# Patient Record
Sex: Male | Born: 1955 | Race: White | Hispanic: No | Marital: Married | Smoking: Former smoker
Health system: Southern US, Community
[De-identification: ages and names within clinical notes are randomized; demographics above are authoritative.]

## PROBLEM LIST (undated history)

## (undated) DIAGNOSIS — E785 Hyperlipidemia, unspecified: Secondary | ICD-10-CM

## (undated) DIAGNOSIS — F32A Depression, unspecified: Secondary | ICD-10-CM

## (undated) DIAGNOSIS — K219 Gastro-esophageal reflux disease without esophagitis: Secondary | ICD-10-CM

## (undated) DIAGNOSIS — R7302 Impaired glucose tolerance (oral): Secondary | ICD-10-CM

## (undated) DIAGNOSIS — F419 Anxiety disorder, unspecified: Secondary | ICD-10-CM

## (undated) DIAGNOSIS — T7840XA Allergy, unspecified, initial encounter: Secondary | ICD-10-CM

## (undated) DIAGNOSIS — F329 Major depressive disorder, single episode, unspecified: Secondary | ICD-10-CM

## (undated) HISTORY — DX: Major depressive disorder, single episode, unspecified: F32.9

## (undated) HISTORY — DX: Depression, unspecified: F32.A

## (undated) HISTORY — DX: Allergy, unspecified, initial encounter: T78.40XA

## (undated) HISTORY — DX: Impaired glucose tolerance (oral): R73.02

## (undated) HISTORY — DX: Anxiety disorder, unspecified: F41.9

## (undated) HISTORY — PX: APPENDECTOMY: SHX54

## (undated) HISTORY — PX: CHOLECYSTECTOMY: SHX55

## (undated) HISTORY — DX: Gastro-esophageal reflux disease without esophagitis: K21.9

## (undated) HISTORY — DX: Hyperlipidemia, unspecified: E78.5

---

## 1998-08-02 HISTORY — PX: POLYSOMNOGRAPHY: SHX453

## 2001-05-24 ENCOUNTER — Encounter: Admission: RE | Admit: 2001-05-24 | Discharge: 2001-05-24 | Payer: Self-pay | Admitting: Family Medicine

## 2001-05-30 ENCOUNTER — Encounter: Admission: RE | Admit: 2001-05-30 | Discharge: 2001-05-30 | Payer: Self-pay | Admitting: Family Medicine

## 2001-06-21 ENCOUNTER — Encounter: Admission: RE | Admit: 2001-06-21 | Discharge: 2001-06-21 | Payer: Self-pay | Admitting: Family Medicine

## 2002-07-04 ENCOUNTER — Encounter: Admission: RE | Admit: 2002-07-04 | Discharge: 2002-07-04 | Payer: Self-pay | Admitting: Family Medicine

## 2002-08-08 ENCOUNTER — Encounter: Admission: RE | Admit: 2002-08-08 | Discharge: 2002-08-08 | Payer: Self-pay | Admitting: Family Medicine

## 2002-09-20 ENCOUNTER — Encounter: Admission: RE | Admit: 2002-09-20 | Discharge: 2002-09-20 | Payer: Self-pay | Admitting: Family Medicine

## 2002-09-25 ENCOUNTER — Encounter: Admission: RE | Admit: 2002-09-25 | Discharge: 2002-09-25 | Payer: Self-pay | Admitting: Family Medicine

## 2002-10-02 ENCOUNTER — Encounter: Payer: Self-pay | Admitting: Sports Medicine

## 2002-10-02 ENCOUNTER — Encounter: Admission: RE | Admit: 2002-10-02 | Discharge: 2002-10-02 | Payer: Self-pay | Admitting: Sports Medicine

## 2002-10-22 ENCOUNTER — Ambulatory Visit (HOSPITAL_COMMUNITY): Admission: RE | Admit: 2002-10-22 | Discharge: 2002-10-22 | Payer: Self-pay | Admitting: Family Medicine

## 2002-10-22 ENCOUNTER — Encounter: Payer: Self-pay | Admitting: Family Medicine

## 2002-10-31 ENCOUNTER — Encounter: Admission: RE | Admit: 2002-10-31 | Discharge: 2002-10-31 | Payer: Self-pay | Admitting: Family Medicine

## 2003-04-17 ENCOUNTER — Encounter: Admission: RE | Admit: 2003-04-17 | Discharge: 2003-04-17 | Payer: Self-pay | Admitting: Family Medicine

## 2003-05-01 ENCOUNTER — Encounter: Admission: RE | Admit: 2003-05-01 | Discharge: 2003-05-01 | Payer: Self-pay | Admitting: Family Medicine

## 2003-05-07 ENCOUNTER — Encounter: Admission: RE | Admit: 2003-05-07 | Discharge: 2003-05-07 | Payer: Self-pay | Admitting: Family Medicine

## 2003-05-09 ENCOUNTER — Encounter: Admission: RE | Admit: 2003-05-09 | Discharge: 2003-05-09 | Payer: Self-pay | Admitting: Family Medicine

## 2003-05-10 ENCOUNTER — Encounter: Admission: RE | Admit: 2003-05-10 | Discharge: 2003-05-10 | Payer: Self-pay | Admitting: Sports Medicine

## 2003-05-10 ENCOUNTER — Encounter: Payer: Self-pay | Admitting: Sports Medicine

## 2003-05-16 ENCOUNTER — Encounter: Admission: RE | Admit: 2003-05-16 | Discharge: 2003-05-16 | Payer: Self-pay | Admitting: Sports Medicine

## 2003-05-16 ENCOUNTER — Encounter: Admission: RE | Admit: 2003-05-16 | Discharge: 2003-05-16 | Payer: Self-pay | Admitting: Family Medicine

## 2003-05-30 ENCOUNTER — Encounter: Admission: RE | Admit: 2003-05-30 | Discharge: 2003-05-30 | Payer: Self-pay | Admitting: Family Medicine

## 2003-06-06 ENCOUNTER — Encounter: Admission: RE | Admit: 2003-06-06 | Discharge: 2003-06-06 | Payer: Self-pay | Admitting: Sports Medicine

## 2003-06-07 ENCOUNTER — Encounter: Admission: RE | Admit: 2003-06-07 | Discharge: 2003-06-07 | Payer: Self-pay | Admitting: Sports Medicine

## 2003-06-20 ENCOUNTER — Encounter: Admission: RE | Admit: 2003-06-20 | Discharge: 2003-06-20 | Payer: Self-pay | Admitting: Family Medicine

## 2003-07-04 ENCOUNTER — Encounter: Admission: RE | Admit: 2003-07-04 | Discharge: 2003-07-04 | Payer: Self-pay | Admitting: Family Medicine

## 2003-07-11 ENCOUNTER — Encounter: Admission: RE | Admit: 2003-07-11 | Discharge: 2003-07-11 | Payer: Self-pay | Admitting: Family Medicine

## 2003-07-18 ENCOUNTER — Encounter: Admission: RE | Admit: 2003-07-18 | Discharge: 2003-07-18 | Payer: Self-pay | Admitting: Family Medicine

## 2003-08-01 ENCOUNTER — Encounter: Admission: RE | Admit: 2003-08-01 | Discharge: 2003-08-01 | Payer: Self-pay | Admitting: Family Medicine

## 2003-08-08 ENCOUNTER — Encounter: Admission: RE | Admit: 2003-08-08 | Discharge: 2003-08-08 | Payer: Self-pay | Admitting: Family Medicine

## 2003-08-15 ENCOUNTER — Encounter: Admission: RE | Admit: 2003-08-15 | Discharge: 2003-08-15 | Payer: Self-pay | Admitting: Sports Medicine

## 2003-08-29 ENCOUNTER — Encounter: Admission: RE | Admit: 2003-08-29 | Discharge: 2003-08-29 | Payer: Self-pay | Admitting: Family Medicine

## 2003-12-19 ENCOUNTER — Encounter: Admission: RE | Admit: 2003-12-19 | Discharge: 2003-12-19 | Payer: Self-pay | Admitting: Sports Medicine

## 2003-12-20 ENCOUNTER — Encounter: Admission: RE | Admit: 2003-12-20 | Discharge: 2003-12-20 | Payer: Self-pay | Admitting: Sports Medicine

## 2006-07-08 ENCOUNTER — Observation Stay (HOSPITAL_COMMUNITY): Admission: EM | Admit: 2006-07-08 | Discharge: 2006-07-09 | Payer: Self-pay | Admitting: Emergency Medicine

## 2007-07-11 LAB — HM COLONOSCOPY: HM COLON: NORMAL

## 2010-03-06 ENCOUNTER — Encounter: Admission: RE | Admit: 2010-03-06 | Discharge: 2010-03-06 | Payer: Self-pay | Admitting: Internal Medicine

## 2010-03-13 ENCOUNTER — Inpatient Hospital Stay (HOSPITAL_COMMUNITY): Admission: AD | Admit: 2010-03-13 | Discharge: 2010-03-14 | Payer: Self-pay | Admitting: Family Medicine

## 2010-03-13 ENCOUNTER — Ambulatory Visit: Payer: Self-pay | Admitting: Family Medicine

## 2010-03-16 ENCOUNTER — Emergency Department (HOSPITAL_COMMUNITY): Admission: EM | Admit: 2010-03-16 | Discharge: 2010-03-16 | Payer: Self-pay | Admitting: Emergency Medicine

## 2010-03-20 ENCOUNTER — Encounter: Payer: Self-pay | Admitting: Family Medicine

## 2010-03-20 ENCOUNTER — Inpatient Hospital Stay (HOSPITAL_COMMUNITY): Admission: EM | Admit: 2010-03-20 | Discharge: 2010-03-21 | Payer: Self-pay | Admitting: Emergency Medicine

## 2010-06-09 ENCOUNTER — Encounter (INDEPENDENT_AMBULATORY_CARE_PROVIDER_SITE_OTHER): Payer: Self-pay | Admitting: *Deleted

## 2010-09-01 NOTE — Letter (Signed)
Summary: LEC Referral (unable to schedule) Notification  [facility]  [address] [place], [place] [address]   Phone: [phone]  Fax: [phone]      June 09, 2010 Brian Houston 05/09/1956 MRN: [id number]   [facility] [address] [place], [place]  [address]   Dear Dr. Merla Riches:   Thank you for your kind referral of the above patient. We have attempted to schedule the recommended COLONOSCOPY but have been unable to schedule because:  _X_ The patient was not available by phone and/or has not returned our calls.  __ The patient declined to schedule the procedure at this time.  We appreciate the referral and hope that we will have the opportunity to treat this patient in the future.    Sincerely,   [facility]  Vania Rea. Jarold Motto M.D. Hedwig Morton. Juanda Chance M.D. Venita Lick. Russella Dar M.D. Wilhemina Bonito. Marina Goodell M.D. Barbette Hair. Arlyce Dice M.D. Iva Boop M.D. Cheron Every.D.

## 2010-10-08 ENCOUNTER — Other Ambulatory Visit: Payer: Self-pay | Admitting: Family Medicine

## 2010-10-16 LAB — CBC
HCT: 43 % (ref 39.0–52.0)
Hemoglobin: 14.6 g/dL (ref 13.0–17.0)
Hemoglobin: 15.1 g/dL (ref 13.0–17.0)
MCH: 31.3 pg (ref 26.0–34.0)
MCH: 31.4 pg (ref 26.0–34.0)
MCHC: 33.2 g/dL (ref 30.0–36.0)
MCHC: 33.5 g/dL (ref 30.0–36.0)
MCHC: 34 g/dL (ref 30.0–36.0)
MCV: 92.5 fL (ref 78.0–100.0)
MCV: 93.7 fL (ref 78.0–100.0)
Platelets: 364 10*3/uL (ref 150–400)
Platelets: 393 10*3/uL (ref 150–400)
Platelets: 436 10*3/uL — ABNORMAL HIGH (ref 150–400)
Platelets: 500 10*3/uL — ABNORMAL HIGH (ref 150–400)
RBC: 4.11 MIL/uL — ABNORMAL LOW (ref 4.22–5.81)
RBC: 4.27 MIL/uL (ref 4.22–5.81)
RBC: 4.64 MIL/uL (ref 4.22–5.81)
RBC: 4.83 MIL/uL (ref 4.22–5.81)
RDW: 12.8 % (ref 11.5–15.5)
RDW: 12.9 % (ref 11.5–15.5)
WBC: 25.7 10*3/uL — ABNORMAL HIGH (ref 4.0–10.5)

## 2010-10-16 LAB — COMPREHENSIVE METABOLIC PANEL
ALT: 25 U/L (ref 0–53)
ALT: 76 U/L — ABNORMAL HIGH (ref 0–53)
AST: 14 U/L (ref 0–37)
AST: 20 U/L (ref 0–37)
Albumin: 2.6 g/dL — ABNORMAL LOW (ref 3.5–5.2)
Albumin: 3.1 g/dL — ABNORMAL LOW (ref 3.5–5.2)
Albumin: 3.3 g/dL — ABNORMAL LOW (ref 3.5–5.2)
Alkaline Phosphatase: 102 U/L (ref 39–117)
Alkaline Phosphatase: 109 U/L (ref 39–117)
BUN: 14 mg/dL (ref 6–23)
BUN: 15 mg/dL (ref 6–23)
BUN: 16 mg/dL (ref 6–23)
BUN: 19 mg/dL (ref 6–23)
BUN: 20 mg/dL (ref 6–23)
CO2: 27 mEq/L (ref 19–32)
CO2: 27 mEq/L (ref 19–32)
CO2: 28 mEq/L (ref 19–32)
Calcium: 8.9 mg/dL (ref 8.4–10.5)
Calcium: 9.2 mg/dL (ref 8.4–10.5)
Chloride: 101 mEq/L (ref 96–112)
Chloride: 102 mEq/L (ref 96–112)
Chloride: 102 mEq/L (ref 96–112)
Chloride: 105 mEq/L (ref 96–112)
Creatinine, Ser: 0.98 mg/dL (ref 0.4–1.5)
Creatinine, Ser: 1.02 mg/dL (ref 0.4–1.5)
Creatinine, Ser: 1.11 mg/dL (ref 0.4–1.5)
GFR calc Af Amer: >60 mL/min (ref >60)
GFR calc Af Amer: >60 mL/min (ref >60)
GFR calc Af Amer: >60 mL/min (ref >60)
GFR calc non Af Amer: >60 mL/min (ref >60)
GFR calc non Af Amer: >60 mL/min (ref >60)
GFR calc non Af Amer: >60 mL/min (ref >60)
GFR calc non Af Amer: >60 mL/min (ref >60)
Glucose, Bld: 130 mg/dL — ABNORMAL HIGH (ref 70–99)
Glucose, Bld: 132 mg/dL — ABNORMAL HIGH (ref 70–99)
Glucose, Bld: 142 mg/dL — ABNORMAL HIGH (ref 70–99)
Potassium: 3.6 mEq/L (ref 3.5–5.1)
Potassium: 3.6 mEq/L (ref 3.5–5.1)
Potassium: 3.7 mEq/L (ref 3.5–5.1)
Potassium: 4.7 mEq/L (ref 3.5–5.1)
Sodium: 139 mEq/L (ref 135–145)
Sodium: 140 mEq/L (ref 135–145)
Total Bilirubin: 0.6 mg/dL (ref 0.3–1.2)
Total Bilirubin: 0.9 mg/dL (ref 0.3–1.2)
Total Protein: 5.1 g/dL — ABNORMAL LOW (ref 6.0–8.3)

## 2010-10-16 LAB — URINALYSIS, ROUTINE W REFLEX MICROSCOPIC
Bilirubin Urine: NEGATIVE
Glucose, UA: NEGATIVE mg/dL
Glucose, UA: NEGATIVE mg/dL
Glucose, UA: NEGATIVE mg/dL
Hgb urine dipstick: NEGATIVE
Hgb urine dipstick: NEGATIVE
Ketones, ur: 15 mg/dL — AB
Ketones, ur: NEGATIVE mg/dL
Protein, ur: NEGATIVE mg/dL
Protein, ur: NEGATIVE mg/dL
Protein, ur: NEGATIVE mg/dL
Specific Gravity, Urine: 1.02 (ref 1.005–1.030)
Specific Gravity, Urine: 1.02 (ref 1.005–1.030)
pH: 6.5 (ref 5.0–8.0)
pH: 7 (ref 5.0–8.0)

## 2010-10-16 LAB — DIFFERENTIAL
Basophils Absolute: 0 10*3/uL (ref 0.0–0.1)
Eosinophils Absolute: 0 10*3/uL (ref 0.0–0.7)
Eosinophils Absolute: 0 10*3/uL (ref 0.0–0.7)
Eosinophils Relative: 0 % (ref 0–5)
Lymphocytes Relative: 13 % (ref 12–46)
Lymphocytes Relative: 9 % — ABNORMAL LOW (ref 12–46)
Lymphs Abs: 2.3 10*3/uL (ref 0.7–4.0)
Lymphs Abs: 3.4 10*3/uL (ref 0.7–4.0)
Monocytes Absolute: 1 10*3/uL (ref 0.1–1.0)
Monocytes Relative: 4 % (ref 3–12)

## 2010-10-16 LAB — LACTIC ACID, PLASMA: Lactic Acid, Venous: 1.5 mmol/L (ref 0.5–2.2)

## 2010-10-16 LAB — CULTURE, BLOOD (ROUTINE X 2)
Culture: NO GROWTH
Culture: NO GROWTH

## 2010-10-16 LAB — HEPATITIS PANEL, ACUTE
Hep A IgM: NEGATIVE
Hep B C IgM: NEGATIVE
Hepatitis B Surface Ag: NEGATIVE

## 2010-10-16 LAB — CLOSTRIDIUM DIFFICILE EIA

## 2010-10-16 LAB — URINE CULTURE
Colony Count: NO GROWTH
Culture  Setup Time: 201108130312
Special Requests: NEGATIVE

## 2010-10-16 LAB — GIARDIA/CRYPTOSPORIDIUM SCREEN(EIA): Giardia Screen - EIA: NEGATIVE

## 2010-10-16 LAB — POCT CARDIAC MARKERS
CKMB, poc: <1 ng/mL — ABNORMAL LOW (ref 1.0–8.0)
CKMB, poc: <1 ng/mL — ABNORMAL LOW (ref 1.0–8.0)
Troponin i, poc: <0.05 ng/mL (ref 0.00–0.09)
Troponin i, poc: <0.05 ng/mL (ref 0.00–0.09)

## 2010-10-16 LAB — RPR: RPR Ser Ql: NONREACTIVE

## 2010-10-16 LAB — STOOL CULTURE

## 2010-12-18 NOTE — H&P (Signed)
NAMEBRISCOE, DANIELLO                ACCOUNT NO.:  [id number]   MEDICAL RECORD NO.:  [id number]          PATIENT TYPE:  EMS   LOCATION:  MAJO                         FACILITY:  [facility]   PHYSICIAN:  Corinna L. Lendell Caprice, MDDATE OF BIRTH:  [birth year]   DATE OF ADMISSION:  07/08/2006  DATE OF DISCHARGE:                              HISTORY & PHYSICAL   CHIEF COMPLAINT:  Chest pain and left arm weakness.   HISTORY OF PRESENT ILLNESS:  Mr. Biegel is a 55 year old white male with  a history of anxiety who presents to the emergency room with left-sided  chest pain, left arm pain and weakness.  He also had a cramp between his  shoulder blades.  He felt extremely weak today.  He has had weakness and  dyspnea on exertion for the past several weeks.  He had aspirin and  nitroglycerin en route without much change in his symptoms.  He felt  that his arm was weak and he could barely hold up the gun when he was  deer hunting today.  He had a panic attack 30 years ago and had  shortness of breath with that.  He had been on Paxil, but stopped it two  weeks ago abruptly.  His arm pain and weakness is better now.  He has no  shortness of breath currently.   PAST MEDICAL HISTORY:  Anxiety disorder.   MEDICATIONS:  None.   ALLERGIES:  PENICILLIN.   PAST SURGICAL HISTORY:  Appendectomy.   SOCIAL HISTORY:  The patient is a Education officer, environmental of a church.  He does not  drink, smoke or use drugs.   FAMILY HISTORY:  Significant for diabetes.  No heart disease.   REVIEW OF SYSTEMS:  As above, otherwise negative.   PHYSICAL EXAMINATION:  VITAL SIGNS:  His temperature is 97.3, blood  pressure initially was 85/50, currently 109/76.  Heart rate 82,  respiratory rate 20, oxygen saturation 97% on room air.  GENERAL:  The patient is well-nourished, well-developed and in no acute  distress.  HEENT:  Normocephalic, atraumatic.  Pupils equal, round, and reactive to  light.  Sclerae nonicteric.  Moist mucous membranes.  NECK:  Supple.  No carotid bruits.  LUNGS:  Clear to auscultation bilaterally without wheezes, rales, or  rhonchi.  CARDIOVASCULAR:  Regular rate and rhythm without murmurs, rubs, or  gallops. No chest wall tenderness.  ABDOMEN:  Soft, nontender, nondistended.  GU/RECTAL:  Deferred.  EXTREMITIES:  No clubbing, cyanosis, or edema.  He has no tenderness  about his left arm and full range of motion in all joints of the left  arm.  NEUROLOGIC:  Alert and oriented.  Cranial nerves intact.  Motor strength  5/5.  Sensation intact.  Deep tendon reflexes equal.  Skin no rash.  PSYCHIATRIC:  Normal affect.   LABORATORY:  Creatinine 1.1, one set of point of care enzymes and one  set of cardiac enzymes are negative.  Chest x-ray one view shows  questionable 4 mm diameter nodular density left base.  EKG shows normal  sinus rhythm.   ASSESSMENT/PLAN:  1. Atypical chest pain.  The patient will be placed on observation.  I      will rule out myocardial infarction.  I will also get a D-dimer.      Since he has had dyspnea on exertion, I will also check a b-type      natruretic peptide.  Consider an echocardiogram at some point.      With it being the weekend, I do not think he needs a STAT      echocardiogram, but maybe as an outpatient if he rules out for      myocardial infarction.  I will continue aspirin.  2. Reported left arm weakness, resolved currently.  The patient has a      nonfocal exam.  I will get a CT brain rule out stroke.  3. History of anxiety disorder.  This may all be anxiety related.  4. Dyspnea on exertion.  See above.  I will also check a TSH.  5. Indeterminate left lung nodule on portable chest x-ray.  I will get      a PA and lateral chest x-ray to further evaluation.      Corinna L. Lendell Caprice, MD  Electronically Signed     CLS/MEDQ  D:  07/08/2006  T:  07/09/2006  Job:  [id number]

## 2011-07-17 ENCOUNTER — Ambulatory Visit (INDEPENDENT_AMBULATORY_CARE_PROVIDER_SITE_OTHER): Payer: PRIVATE HEALTH INSURANCE

## 2011-07-17 DIAGNOSIS — E789 Disorder of lipoprotein metabolism, unspecified: Secondary | ICD-10-CM

## 2011-07-17 DIAGNOSIS — F411 Generalized anxiety disorder: Secondary | ICD-10-CM

## 2011-07-17 DIAGNOSIS — Z23 Encounter for immunization: Secondary | ICD-10-CM

## 2011-10-18 ENCOUNTER — Ambulatory Visit (INDEPENDENT_AMBULATORY_CARE_PROVIDER_SITE_OTHER): Payer: PRIVATE HEALTH INSURANCE | Admitting: Family Medicine

## 2011-10-18 VITALS — BP 108/73 | HR 65 | Temp 98.0°F | Resp 16 | Ht 71.0 in | Wt 190.2 lb

## 2011-10-18 DIAGNOSIS — G4700 Insomnia, unspecified: Secondary | ICD-10-CM

## 2011-10-18 DIAGNOSIS — F411 Generalized anxiety disorder: Secondary | ICD-10-CM

## 2011-10-18 DIAGNOSIS — R079 Chest pain, unspecified: Secondary | ICD-10-CM

## 2011-10-18 MED ORDER — TOPIRAMATE 50 MG PO TABS: 50.0000 mg | ORAL_TABLET | Freq: Two times a day (BID) | ORAL | Status: DC

## 2011-10-18 NOTE — Progress Notes (Signed)
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   Today on the way from the lawyer he had dizziness, mild nausea, pain between shoulder blades, slight shortness of breath, and mild chest tightness that started about 10:30 and worsened until 11:15 and has largely subsided.  Still a bit lightheaded and has a bit of pain between shoulder blades.  Walking in to the office aggravated the pain.   He had similar symptoms two weeks ago when working out in the field.  Cholesterol total 226 07/17/2011 Family history had aortic valve replacement, mother had wpw  O:  NAD Chest:  Clear Heart:  Reg, no murmur or gallop Abdomen:  Soft, no HSM, nontender EKG: NSR As I talked to him, he began to reflect on him at stress he's been under. He's thought about quitting his job thoughts of suicide. He says that because he has his children and his wife and grandchildren he would never carry out a neck such as this nor has he made plans of any type. He's thinking about taking twice a motorcycle trip this summer. 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 He said 7 suicides in the last year and many more funerals to go to.  Assessment: Patient is under treatment stress, do not feel that he has any risk factors or signs of heart attack. Going to try a different medicine to help him get some rest and he's going to try to get out of town at least one day week to clear his head.  : Plan: Topamax. I've asked patient: Back in 2-3 days to limit however the sleeping hours healing.

## 2012-01-21 ENCOUNTER — Other Ambulatory Visit: Payer: Self-pay | Admitting: Emergency Medicine

## 2012-02-18 ENCOUNTER — Ambulatory Visit (INDEPENDENT_AMBULATORY_CARE_PROVIDER_SITE_OTHER): Payer: PRIVATE HEALTH INSURANCE | Admitting: Family Medicine

## 2012-02-18 VITALS — BP 116/70 | HR 77 | Temp 98.0°F | Resp 16 | Ht 71.0 in | Wt 180.2 lb

## 2012-02-18 DIAGNOSIS — S61209A Unspecified open wound of unspecified finger without damage to nail, initial encounter: Secondary | ICD-10-CM

## 2012-02-18 DIAGNOSIS — M25549 Pain in joints of unspecified hand: Secondary | ICD-10-CM

## 2012-02-18 DIAGNOSIS — F419 Anxiety disorder, unspecified: Secondary | ICD-10-CM

## 2012-02-18 MED ORDER — CLONAZEPAM 0.5 MG PO TABS: ORAL_TABLET | ORAL | Status: DC

## 2012-02-18 NOTE — Progress Notes (Signed)
Subjective: Patient was using a chain saw, and held the branch with his hand. He knew better than it had his left index finger. He just barely grazed him. His tetanus shot is up-to-date.  Objective:  Small flap laceration of his left index finger mid shaft. Wound is approximately 2 seems long. The flexion and extension and sensation are normal. After being anesthetized it was explored and the tendon was visible but not lacerated.  Assessment: Laceration left index finger  Plan: Suture repair. No tetanus shot as needed. Return 10 days.

## 2012-02-18 NOTE — Patient Instructions (Addendum)

## 2012-02-18 NOTE — Progress Notes (Signed)
Patient ID: LOU LOEWE MRN: [id number], DOB: March 25, 1956, 56 y.o. Date of Encounter: 02/18/2012, 2:54 PM   PROCEDURE NOTE: Verbal consent obtained. Sterile technique employed. Numbing: Anesthesia obtained with 2% lidocaine plain  Cleansed with soap and water. Irrigated.  Wound explored, no deep structures involved, no foreign bodies.   Wound repaired with # 13 SI with 4-0 ethilon. Hemostasis obtained. Wound cleansed and dressed.  Wound care instructions including precautions covered with patient. Handout given.  Anticipate suture removal in 7-10 days.   Rhoderick Moody, PA-C 02/18/2012 2:54 PM

## 2012-02-29 ENCOUNTER — Telehealth: Payer: Self-pay

## 2012-02-29 ENCOUNTER — Ambulatory Visit (INDEPENDENT_AMBULATORY_CARE_PROVIDER_SITE_OTHER): Payer: PRIVATE HEALTH INSURANCE | Admitting: Family Medicine

## 2012-02-29 VITALS — BP 100/65 | HR 77 | Temp 98.2°F | Resp 16 | Ht 71.0 in | Wt 180.0 lb

## 2012-02-29 DIAGNOSIS — S61409A Unspecified open wound of unspecified hand, initial encounter: Secondary | ICD-10-CM

## 2012-02-29 MED ORDER — DOXYCYCLINE HYCLATE 100 MG PO TABS: 100.0000 mg | ORAL_TABLET | Freq: Two times a day (BID) | ORAL | Status: AC

## 2012-02-29 NOTE — Progress Notes (Signed)
  Subjective:    Patient ID: Brian Houston, male    DOB: 22-Jul-1956, 56 y.o.   MRN: [id number]  HPI Brian Houston is a 56 y.o. male Here for suture removal L index finger lac 7/19 - repaired with #13 si sutures.  Small amt drainage yesterday am. Occasional stinging, soreness. Bumped wound few days ago - sore, but no known bleeding.  Draining pus yesterday am .  No fever. 3 sutures out on own on home.   Review of Systems  Constitutional: Negative for fever.  Skin: Positive for wound.       Objective:   Physical Exam  Constitutional: He is oriented to person, place, and time. He appears well-developed and well-nourished.  HENT:  Head: Normocephalic and atraumatic.  Pulmonary/Chest: Breath sounds normal.  Neurological: He is alert and oriented to person, place, and time.  Skin: Skin is warm.       L 2nd phalynx - wound intact - full flex and ext resistance. #10 sutures intact - removed without difficulty (pt removed 3 at home).  Middle aspect of scar with small fluctuance laterally, no surrounding erythema.   Psychiatric: He has a normal mood and affect. His behavior is normal.          Assessment & Plan:  Brian Houston is a 56 y.o. male 1. Wound, open, hand with or without fingers  doxycycline (VIBRA-TABS) 100 MG tablet  stitch rxn vs slight infection of wound.  Healing well o/w.  steristrips placed x2, abx above, rtc precautions. Doxy x 1 week.

## 2012-08-24 ENCOUNTER — Emergency Department (HOSPITAL_COMMUNITY)
Admission: EM | Admit: 2012-08-24 | Discharge: 2012-08-24 | Disposition: A | Discharge status: Home / Self Care | Payer: No Typology Code available for payment source | Attending: Emergency Medicine | Admitting: Emergency Medicine

## 2012-08-24 ENCOUNTER — Encounter (HOSPITAL_COMMUNITY): Payer: Self-pay | Admitting: *Deleted

## 2012-08-24 ENCOUNTER — Emergency Department (HOSPITAL_COMMUNITY): Payer: No Typology Code available for payment source

## 2012-08-24 ENCOUNTER — Ambulatory Visit (INDEPENDENT_AMBULATORY_CARE_PROVIDER_SITE_OTHER): Payer: PRIVATE HEALTH INSURANCE | Admitting: Emergency Medicine

## 2012-08-24 VITALS — BP 110/78 | HR 81 | Temp 98.1°F | Resp 18 | Wt 181.0 lb

## 2012-08-24 DIAGNOSIS — Z79899 Other long term (current) drug therapy: Secondary | ICD-10-CM | POA: Insufficient documentation

## 2012-08-24 DIAGNOSIS — R0602 Shortness of breath: Secondary | ICD-10-CM | POA: Insufficient documentation

## 2012-08-24 DIAGNOSIS — F329 Major depressive disorder, single episode, unspecified: Secondary | ICD-10-CM | POA: Insufficient documentation

## 2012-08-24 DIAGNOSIS — R0789 Other chest pain: Secondary | ICD-10-CM | POA: Insufficient documentation

## 2012-08-24 DIAGNOSIS — F3289 Other specified depressive episodes: Secondary | ICD-10-CM | POA: Insufficient documentation

## 2012-08-24 DIAGNOSIS — F411 Generalized anxiety disorder: Secondary | ICD-10-CM | POA: Insufficient documentation

## 2012-08-24 DIAGNOSIS — R079 Chest pain, unspecified: Secondary | ICD-10-CM

## 2012-08-24 DIAGNOSIS — R110 Nausea: Secondary | ICD-10-CM | POA: Insufficient documentation

## 2012-08-24 LAB — BASIC METABOLIC PANEL
CO2: 26 mEq/L (ref 19–32)
Chloride: 102 mEq/L (ref 96–112)
Glucose, Bld: 109 mg/dL — ABNORMAL HIGH (ref 70–99)
Sodium: 139 mEq/L (ref 135–145)

## 2012-08-24 LAB — TROPONIN I
Troponin I: <0.3 ng/mL (ref <0.30)
Troponin I: <0.3 ng/mL (ref <0.30)

## 2012-08-24 LAB — CBC
HCT: 47.7 % (ref 39.0–52.0)
Hemoglobin: 17.1 g/dL — ABNORMAL HIGH (ref 13.0–17.0)
MCH: 33.3 pg (ref 26.0–34.0)
RBC: 5.14 MIL/uL (ref 4.22–5.81)

## 2012-08-24 MED ORDER — ASPIRIN 81 MG PO CHEW: 324.0000 mg | CHEWABLE_TABLET | Freq: Once | ORAL | Status: DC

## 2012-08-24 NOTE — ED Provider Notes (Signed)
History  This chart was scribed for Glynn Octave, MD by Bennett Scrape, ED Scribe. This patient was seen in room D35C/D35C and the patient's care was started at 3:50 PM.  CSN: [id number]  Arrival date & time 08/24/12  1555   None     Chief Complaint  Patient presents with  . Chest Pain     Patient is a 57 y.o. male presenting with chest pain. The history is provided by the patient. No language interpreter was used.  Chest Pain Episode onset: intermittently today, last episode started 3 hours ago. Duration of episode(s) is 2 hours. Chest pain occurs intermittently. The chest pain is resolved. Associated with: possible anxiety. The pain is currently at 0/10. The severity of the pain is moderate. The quality of the pain is described as squeezing. The pain does not radiate. He tried nitroglycerin and aspirin for the symptoms.  Pertinent negatives for past medical history include no diabetes, no hyperlipidemia, no hypertension, no MI and no PE.  Procedure history is negative for cardiac catheterization and stress echo.     Brian Houston is a 57 y.o. male with a h/o anxiety for which he takes klonopin for brought in by ambulance, who presents to the Emergency Department complaining of gradual onset, intermittent CP described as a squeezing and tightness located sternally with associated generalized weakness, light headed, SOB, anxiety and posterior left shoulder/back pain described as sharp and cramping. He denies having any symptoms currently. He states that the first episode started yesterday morning around 9:30 AM and resolved after 2 hours but returned last night, this AM and around 1 PM today. He denies having any modifying factors. He was seen at Urgent Care for the same, given 4 baby ASA and sent here for further evaluation. EMS denies giving ASA or nitro en route. He denies having a h/o cardiac problems or denies a family h/o cardiac problems. He also denies having a h/o HTN, DVT, PE,  HLD, DM and HTN. He reports that he has been seen at Urgent care for the same in the past and has had prior EKGs that were normal but has not followed up with a Cardiologist or had a cath or stress test performed. He reports that he experiences palpitations, SOB, similar CP and anxiety that usually lasts 2 hours straight and then resolves. He states that he went in for evaluation today due to the weakness which he states is more pronounced than prior. It is generalized, non-focal weakness. He reports an intermittent non-produtice cough for almost one year, but denies new cough, nausea, emesis, diarrhea, new abdominal pain, diaphoresis, fever, leg swelling and rash as associated symptoms. He also has a h/o depression and is a former smoker (quit in 1982).  PCP is [facility].  Past Medical History  Diagnosis Date  . Depression   . Anxiety     Past Surgical History  Procedure Date  . Appendectomy   . Cholecystectomy     Family History  Problem Relation Age of Onset  . Anxiety disorder Mother   . Stroke Father   . COPD Father   . Anxiety disorder Brother   . Anxiety disorder Daughter   . Anxiety disorder Maternal Grandmother   . Diabetes Paternal Grandfather     History  Substance Use Topics  . Smoking status: Never Smoker   . Smokeless tobacco: Not on file  . Alcohol Use: No      Review of Systems  A complete 10 system  review of systems was obtained and all systems are negative except as noted in the HPI and PMH.   Allergies  Remeron; Omeprazole; and Penicillins  Home Medications   Current Outpatient Rx  Name  Route  Sig  Dispense  Refill  . CLONAZEPAM 0.5 MG PO TABS      Take 1/2 as needed once daily for anxiety   30 tablet   3   . IBUPROFEN 200 MG PO TABS   Oral   Take 400 mg by mouth every 6 (six) hours as needed. For pain           Triage Vitals: BP 138/83  Pulse 65  Temp 98.2 F (36.8 C) (Oral)  Resp 22  SpO2 100%  Physical Exam  Nursing  note and vitals reviewed. Constitutional: He is oriented to person, place, and time. He appears well-developed and well-nourished. No distress.  HENT:  Head: Normocephalic and atraumatic.  Mouth/Throat: Oropharynx is clear and moist.  Eyes: Conjunctivae normal and EOM are normal. Pupils are equal, round, and reactive to light.  Neck: Neck supple. No tracheal deviation present.  Cardiovascular: Normal rate and regular rhythm.   Pulmonary/Chest: Effort normal and breath sounds normal. No respiratory distress. He exhibits no tenderness (no reproducible chest tenderness upon palpation).  Abdominal: Soft. Bowel sounds are normal. There is no tenderness.  Musculoskeletal: Normal range of motion. He exhibits no edema (no edema or calf tenderness) and no tenderness.       No C, T or L spine tenderness  Neurological: He is alert and oriented to person, place, and time.       5/5 strength throughout  Skin: Skin is warm and dry.  Psychiatric: He has a normal mood and affect. His behavior is normal.    ED Course  Procedures (including critical care time)  DIAGNOSTIC STUDIES: Oxygen Saturation is 100% on room air, normal by my interpretation.    COORDINATION OF CARE: 4:06 PM-Discussed treatment plan which includes CXR, CBC panel, and troponin with pt at bedside and pt agreed to plan.    Labs Reviewed  CBC - Abnormal; Notable for the following:    Hemoglobin 17.1 (*)     All other components within normal limits  BASIC METABOLIC PANEL - Abnormal; Notable for the following:    Glucose, Bld 109 (*)     All other components within normal limits  TROPONIN I  TROPONIN I   Dg Chest 2 View  08/24/2012  *RADIOLOGY REPORT*  Clinical Data: Chest pain and shortness of breath  CHEST - 2 VIEW  Comparison: 03/13/2010  Findings: Normal heart size, mediastinal contours, and pulmonary vascularity. Lungs clear. No pleural effusion or pneumothorax. Bones unremarkable.  IMPRESSION: No acute abnormalities.    Original Report Authenticated By: Ulyses Southward, M.D.      1. Chest pain, atypical      Date: 08/24/2012  Rate: 66  Rhythm: normal sinus rhythm  QRS Axis: normal  Intervals: normal  ST/T Wave abnormalities: normal  Conduction Disutrbances:none  Narrative Interpretation:   Old EKG Reviewed: unchanged    MDM   57 y/o  Male p/w chest pain and generalized fatigue. Prior episodes in past. Occasional SOB. Associated anxiety. No diaphoresis. CP free throughout his stay in ED. HDS. Similar to prior episodes.  Unlikely PNA as CXR negative, no leukocytosis, no fever Unlikely ACS as troponin negative, EKG unremarkable, low risk per [facility]  Unlikely PE as atypical presentation, low risk per [facility], Doubt Aortic Dissection, Pancreatitis, Arrhythmia,  Pneumothorax, Endo/Myo/Pericarditis, Shingles, Emergent complications of an Ulcer, Esophageal pathology, or other emergent pathology.   Dc home. Strict return precautions given. Follow up with primary care provider. Patient in agreement with plan       I personally performed the services described in this documentation, which was scribed in my presence. The recorded information has been reviewed and is accurate.     Stevie Kern, MD 08/24/12 2105

## 2012-08-24 NOTE — ED Notes (Signed)
Reports has had 3 episodes of SSCP, non radiating. Last episode started 0930 today & lasted 1 hr.  States CP always started while at rest. Described pain as tightness. Denies fever, cold, cough.  Stated, "I really think it was my anxiety". Takes klonopin PRN for anxiety. Has had similar CP in past which was relieved after taking med.

## 2012-08-24 NOTE — ED Notes (Signed)
From Urgent care - C/o intermittent episodes of SSCP since yesterday with generalized weakness. Denies SOB, diaphoresis, nausea no emesis.  Given ASA 324mg  prior to arrival. Denies CP presently

## 2012-08-24 NOTE — Progress Notes (Signed)
[facility] [address], [place] [address] [phone]  Date:  08/24/2012   Name:  Brian Houston   DOB:  27-Nov-1955   MRN:  [id number]  PCP:  [facility]    Chief Complaint: Chest Pain   History of Present Illness:  Brian Houston is a 57 y.o. very pleasant male patient who presents with the following:  Ill with chest pressure with pain into his back last night for a couple of hours then ended.  This morning about an hour after waking up had recurrent pain that lasted all day and is now not as intense as earlier in the day.  Radiates into his back and is otherwise not radiating.  Associated with nausea and shortness of breath that comes and goes.  Thinks that his problem is anxiety. Feels weak and short of breath.  Has elevated lipid but no diabetes or tobacco use.   There is no problem list on file for this patient.   Past Medical History  Diagnosis Date  . Depression   . Anxiety     Past Surgical History  Procedure Date  . Appendectomy   . Cholecystectomy     History  Substance Use Topics  . Smoking status: Never Smoker   . Smokeless tobacco: Not on file  . Alcohol Use: No    Family History  Problem Relation Age of Onset  . Anxiety disorder Mother   . Stroke Father   . COPD Father   . Anxiety disorder Brother   . Anxiety disorder Daughter   . Anxiety disorder Maternal Grandmother   . Diabetes Paternal Grandfather     Allergies  Allergen Reactions  . Omeprazole   . Paroxetine Hcl   . Penicillins   . Remeron (Mirtazapine)     Medication list has been reviewed and updated.  Current Outpatient Prescriptions on File Prior to Visit  Medication Sig Dispense Refill  . clonazePAM (KLONOPIN) 0.5 MG tablet Take 1/2 as needed once daily for anxiety  30 tablet  3  . Multiple Vitamin (MULTIVITAMINS PO) Take 1 tablet by mouth daily.      Marland Kitchen atorvastatin (LIPITOR) 20 MG tablet Take 20 mg by mouth daily.      Marland Kitchen topiramate (TOPAMAX) 50  MG tablet Take 1 tablet (50 mg total) by mouth 2 (two) times daily.  30 tablet  6    Review of Systems:  As per HPI, otherwise negative.    Physical Examination: Filed Vitals:   08/24/12 1429  BP: 110/78  Pulse: 81  Temp: 98.1 F (36.7 C)  Resp: 18   Filed Vitals:   08/24/12 1429  Weight: 181 lb (82.101 kg)   There is no height on file to calculate BMI. Ideal Body Weight:    GEN: WDWN, NAD, Non-toxic, A & O x 3 HEENT: Atraumatic, Normocephalic. Neck supple. No masses, No LAD. Ears and Nose: No external deformity. CV: RRR, No M/G/R. No JVD. No thrill. No extra heart sounds. PULM: CTA B, no wheezes, crackles, rhonchi. No retractions. No resp. distress. No accessory muscle use. ABD: S, NT, ND, +BS. No rebound. No HSM. EXTR: No c/c/e NEURO Normal gait.  PSYCH: Normally interactive. Conversant. Not depressed or anxious appearing.  Calm demeanor.    Assessment and Plan: Chest pain EKG No acute changes ASA daily ER via EMS IV Monitor  O2  Carmelina Dane, MD

## 2012-08-25 NOTE — ED Provider Notes (Signed)
I saw and evaluated the patient, reviewed the resident's note and I agree with the findings and plan.  Intermittent substernal chest pain associated with SOB, nausea, anxiety. Happening frequently over past several days, lasting hours at a time. No cardiac history. Previous similar episodes of chest pain attributed to anxiety, never lasting this long.  No CP in ED. EKG nsr without ST changes.  Troponin negative.  Risk factors: hx smoking. Delta troponin negative. Offered CT coronary and observation but declined and will schedule stress test as outpatient.  Glynn Octave, MD 08/25/12 [phone]

## 2012-08-28 ENCOUNTER — Ambulatory Visit (INDEPENDENT_AMBULATORY_CARE_PROVIDER_SITE_OTHER): Payer: PRIVATE HEALTH INSURANCE | Admitting: Family Medicine

## 2012-08-28 VITALS — BP 123/82 | HR 79 | Temp 98.4°F | Resp 16 | Ht 72.0 in | Wt 181.0 lb

## 2012-08-28 DIAGNOSIS — R079 Chest pain, unspecified: Secondary | ICD-10-CM

## 2012-08-28 NOTE — Patient Instructions (Addendum)
Call 911 or go to the emergency room if in all worse.  See Dr.Vyas at 3 PM tomorrow.  Use your clonazepam as needed, but when this is clearly return for [place] to get you on a longer term antianxiety medication.

## 2012-08-28 NOTE — Progress Notes (Signed)
Subjective: Patient came in on an emergency basis with chest pain. He was treated last week with similar problems and the symptoms recurred workup was negative. He was visiting with a friend today. There was no major distress. He developed acute pain medial to his left scapula. The pain went through the chest,  To the left side of neck, andto the anterior chest.  No diaphoresis. He did have some nausea. It has come and gone. He drove himself to the urgent care  He has a history of anxiety. He realizes that this may be anxiety. He has no other major risk factors that he knows of. He is not a smoker. No hypertension. He took an aspirin today.  Objective: Alert in a, anxious looking. Throat clear. Neck supple without nodes. Chest is clear to auscultation. Heart regular without murmurs gallops or arrhythmias. Chest wall is not particularly tender. EKG is normal. No change from a few days ago.  Assessment: Atypical chest pain Anxiety  Plan: Discussed with Dr. Jacinto Halim.  He felt like the patient should be seen tomorrow, and arrange for him to see one of his associates tomorrow at the office. The patient knows to take it easy and to go to the ER if at all worse. The patient is in agreement with this. Do not feel like he needs to be transported back to the ER at this time.

## 2012-08-30 ENCOUNTER — Ambulatory Visit: Payer: PRIVATE HEALTH INSURANCE

## 2012-08-30 ENCOUNTER — Ambulatory Visit (INDEPENDENT_AMBULATORY_CARE_PROVIDER_SITE_OTHER): Payer: PRIVATE HEALTH INSURANCE | Admitting: Family Medicine

## 2012-08-30 ENCOUNTER — Encounter: Payer: Self-pay | Admitting: Family Medicine

## 2012-08-30 VITALS — BP 118/64 | HR 64 | Temp 97.5°F | Resp 16 | Ht 70.0 in | Wt 181.5 lb

## 2012-08-30 DIAGNOSIS — M79609 Pain in unspecified limb: Secondary | ICD-10-CM

## 2012-08-30 DIAGNOSIS — M79673 Pain in unspecified foot: Secondary | ICD-10-CM

## 2012-08-30 DIAGNOSIS — F411 Generalized anxiety disorder: Secondary | ICD-10-CM

## 2012-08-30 DIAGNOSIS — F419 Anxiety disorder, unspecified: Secondary | ICD-10-CM

## 2012-08-30 DIAGNOSIS — R079 Chest pain, unspecified: Secondary | ICD-10-CM

## 2012-08-30 DIAGNOSIS — R1011 Right upper quadrant pain: Secondary | ICD-10-CM

## 2012-08-30 DIAGNOSIS — G4700 Insomnia, unspecified: Secondary | ICD-10-CM

## 2012-08-30 DIAGNOSIS — M546 Pain in thoracic spine: Secondary | ICD-10-CM

## 2012-08-30 MED ORDER — SERTRALINE HCL 50 MG PO TABS: 50.0000 mg | ORAL_TABLET | Freq: Every day | ORAL | Status: DC
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
## 2012-08-30 NOTE — Patient Instructions (Addendum)
1. Anxiety    2. Insomnia    3. Chest pain    4. Thoracic back pain    5. Heel pain  DG Ankle Complete Left, DG Foot Complete Left   Plantar Fasciitis (Heel Spur Syndrome) with Rehab The plantar fascia is a fibrous, ligament-like, soft-tissue structure that spans the bottom of the foot. Plantar fasciitis is a condition that causes pain in the foot due to inflammation of the tissue. SYMPTOMS   Pain and tenderness on the underneath side of the foot.  Pain that worsens with standing or walking. CAUSES  Plantar fasciitis is caused by irritation and injury to the plantar fascia on the underneath side of the foot. Common mechanisms of injury include:  Direct trauma to bottom of the foot.  Damage to a small nerve that runs under the foot where the main fascia attaches to the heel bone.  Stress placed on the plantar fascia due to bone spurs. RISK INCREASES WITH:   Activities that place stress on the plantar fascia (running, jumping, pivoting, or cutting).  Poor strength and flexibility.  Improperly fitted shoes.  Tight calf muscles.  Flat feet.  Failure to warm-up properly before activity.  Obesity. PREVENTION  Warm up and stretch properly before activity.  Allow for adequate recovery between workouts.  Maintain physical fitness:  Strength, flexibility, and endurance.  Cardiovascular fitness.  Maintain a health body weight.  Avoid stress on the plantar fascia.  Wear properly fitted shoes, including arch supports for individuals who have flat feet. PROGNOSIS  If treated properly, then the symptoms of plantar fasciitis usually resolve without surgery. However, occasionally surgery is necessary. RELATED COMPLICATIONS   Recurrent symptoms that may result in a chronic condition.  Problems of the lower back that are caused by compensating for the injury, such as limping.  Pain or weakness of the foot during push-off following surgery.  Chronic inflammation, scarring,  and partial or complete fascia tear, occurring more often from repeated injections. TREATMENT  Treatment initially involves the use of ice and medication to help reduce pain and inflammation. The use of strengthening and stretching exercises may help reduce pain with activity, especially stretches of the Achilles tendon. These exercises may be performed at home or with a therapist. Your caregiver may recommend that you use heel cups of arch supports to help reduce stress on the plantar fascia. Occasionally, corticosteroid injections are given to reduce inflammation. If symptoms persist for greater than 6 months despite non-surgical (conservative), then surgery may be recommended.  MEDICATION   If pain medication is necessary, then nonsteroidal anti-inflammatory medications, such as aspirin and ibuprofen, or other minor pain relievers, such as acetaminophen, are often recommended.  Do not take pain medication within 7 days before surgery.  Prescription pain relievers may be given if deemed necessary by your caregiver. Use only as directed and only as much as you need.  Corticosteroid injections may be given by your caregiver. These injections should be reserved for the most serious cases, because they may only be given a certain number of times. HEAT AND COLD  Cold treatment (icing) relieves pain and reduces inflammation. Cold treatment should be applied for 10 to 15 minutes every 2 to 3 hours for inflammation and pain and immediately after any activity that aggravates your symptoms. Use ice packs or massage the area with a piece of ice (ice massage).  Heat treatment may be used prior to performing the stretching and strengthening activities prescribed by your caregiver, physical therapist, or athletic  trainer. Use a heat pack or soak the injury in warm water. SEEK IMMEDIATE MEDICAL CARE IF:  Treatment seems to offer no benefit, or the condition worsens.  Any medications produce adverse side  effects. EXERCISES RANGE OF MOTION (ROM) AND STRETCHING EXERCISES - Plantar Fasciitis (Heel Spur Syndrome) These exercises may help you when beginning to rehabilitate your injury. Your symptoms may resolve with or without further involvement from your physician, physical therapist or athletic trainer. While completing these exercises, remember:   Restoring tissue flexibility helps normal motion to return to the joints. This allows healthier, less painful movement and activity.  An effective stretch should be held for at least 30 seconds.  A stretch should never be painful. You should only feel a gentle lengthening or release in the stretched tissue. RANGE OF MOTION - Toe Extension, Flexion  Sit with your right / left leg crossed over your opposite knee.  Grasp your toes and gently pull them back toward the top of your foot. You should feel a stretch on the bottom of your toes and/or foot.  Hold this stretch for __________ seconds.  Now, gently pull your toes toward the bottom of your foot. You should feel a stretch on the top of your toes and or foot.  Hold this stretch for __________ seconds. Repeat __________ times. Complete this stretch __________ times per day.  RANGE OF MOTION - Ankle Dorsiflexion, Active Assisted  Remove shoes and sit on a chair that is preferably not on a carpeted surface.  Place right / left foot under knee. Extend your opposite leg for support.  Keeping your heel down, slide your right / left foot back toward the chair until you feel a stretch at your ankle or calf. If you do not feel a stretch, slide your bottom forward to the edge of the chair, while still keeping your heel down.  Hold this stretch for __________ seconds. Repeat __________ times. Complete this stretch __________ times per day.  STRETCH  Gastroc, Standing  Place hands on wall.  Extend right / left leg, keeping the front knee somewhat bent.  Slightly point your toes inward on your back  foot.  Keeping your right / left heel on the floor and your knee straight, shift your weight toward the wall, not allowing your back to arch.  You should feel a gentle stretch in the right / left calf. Hold this position for __________ seconds. Repeat __________ times. Complete this stretch __________ times per day. STRETCH  Soleus, Standing  Place hands on wall.  Extend right / left leg, keeping the other knee somewhat bent.  Slightly point your toes inward on your back foot.  Keep your right / left heel on the floor, bend your back knee, and slightly shift your weight over the back leg so that you feel a gentle stretch deep in your back calf.  Hold this position for __________ seconds. Repeat __________ times. Complete this stretch __________ times per day. STRETCH  Gastrocsoleus, Standing  Note: This exercise can place a lot of stress on your foot and ankle. Please complete this exercise only if specifically instructed by your caregiver.   Place the ball of your right / left foot on a step, keeping your other foot firmly on the same step.  Hold on to the wall or a rail for balance.  Slowly lift your other foot, allowing your body weight to press your heel down over the edge of the step.  You should feel a stretch  in your right / left calf.  Hold this position for __________ seconds.  Repeat this exercise with a slight bend in your right / left knee. Repeat __________ times. Complete this stretch __________ times per day.  STRENGTHENING EXERCISES - Plantar Fasciitis (Heel Spur Syndrome)  These exercises may help you when beginning to rehabilitate your injury. They may resolve your symptoms with or without further involvement from your physician, physical therapist or athletic trainer. While completing these exercises, remember:   Muscles can gain both the endurance and the strength needed for everyday activities through controlled exercises.  Complete these exercises as  instructed by your physician, physical therapist or athletic trainer. Progress the resistance and repetitions only as guided. STRENGTH - Towel Curls  Sit in a chair positioned on a non-carpeted surface.  Place your foot on a towel, keeping your heel on the floor.  Pull the towel toward your heel by only curling your toes. Keep your heel on the floor.  If instructed by your physician, physical therapist or athletic trainer, add ____________________ at the end of the towel. Repeat __________ times. Complete this exercise __________ times per day. STRENGTH - Ankle Inversion  Secure one end of a rubber exercise band/tubing to a fixed object (table, pole). Loop the other end around your foot just before your toes.  Place your fists between your knees. This will focus your strengthening at your ankle.  Slowly, pull your big toe up and in, making sure the band/tubing is positioned to resist the entire motion.  Hold this position for __________ seconds.  Have your muscles resist the band/tubing as it slowly pulls your foot back to the starting position. Repeat __________ times. Complete this exercises __________ times per day.  Document Released: 07/19/2005 Document Revised: 10/11/2011 Document Reviewed: 10/31/2008 [facility] Patient Information 2013 [place], [place].

## 2012-09-01 DIAGNOSIS — M546 Pain in thoracic spine: Secondary | ICD-10-CM | POA: Insufficient documentation

## 2012-09-01 DIAGNOSIS — R1011 Right upper quadrant pain: Secondary | ICD-10-CM | POA: Insufficient documentation

## 2012-09-01 DIAGNOSIS — R079 Chest pain, unspecified: Secondary | ICD-10-CM | POA: Insufficient documentation

## 2012-09-01 DIAGNOSIS — M79673 Pain in unspecified foot: Secondary | ICD-10-CM | POA: Insufficient documentation

## 2012-09-01 DIAGNOSIS — F419 Anxiety disorder, unspecified: Secondary | ICD-10-CM | POA: Insufficient documentation

## 2012-09-01 DIAGNOSIS — G4700 Insomnia, unspecified: Secondary | ICD-10-CM | POA: Insufficient documentation

## 2012-09-01 NOTE — Assessment & Plan Note (Signed)
Uncontrolled; rx for Zoloft 50mg  daily; continue Klonopin 0.5mg  1/2 bid scheduled for next month. May take additional 1/2 Klonopin PRN.  Counseled extensively during visit.  Highly recommend psychotherapy.  Close follow-up.

## 2012-09-01 NOTE — Assessment & Plan Note (Signed)
New.  With radiation to anterior chest; onset with acute anxiety.  Musculoskeletal exam normal in office.

## 2012-09-01 NOTE — Assessment & Plan Note (Signed)
New.  Worsens or is triggered with stress/anxiety; improved with Pepcid; continue Pepcid.

## 2012-09-01 NOTE — Assessment & Plan Note (Signed)
New.  Xray negative; treat as Plantar Fasciitis yet some symptoms atypical.  Recommend icing heel qhs; recommend supportive new shoes; also recommend heel cups; home exercises provided to perform daily.

## 2012-09-01 NOTE — Assessment & Plan Note (Signed)
New to this provider but recurrent issue for patient.  Atypical chest pain that is related/worsened with stress/anxiety.  Agree with Pepcid bid.  S/p cardiology consultation this week; scheduled for stress testing next week.

## 2012-09-01 NOTE — Assessment & Plan Note (Signed)
Uncontrolled; pt declined rx; recommend taking Zoloft qhs; also recommend Klonopin 1/2 q evening; may take another 1/2 Klonopin qhs as needed.  Expect insomnia to improve as anxiety improves.

## 2012-09-16 HISTORY — PX: OTHER SURGICAL HISTORY: SHX169

## 2012-10-02 ENCOUNTER — Ambulatory Visit (INDEPENDENT_AMBULATORY_CARE_PROVIDER_SITE_OTHER): Payer: PRIVATE HEALTH INSURANCE | Admitting: Family Medicine

## 2012-10-02 ENCOUNTER — Encounter: Payer: Self-pay | Admitting: Family Medicine

## 2012-10-02 VITALS — BP 97/61 | HR 74 | Temp 97.3°F | Resp 16 | Ht 72.0 in | Wt 184.0 lb

## 2012-10-02 DIAGNOSIS — G4700 Insomnia, unspecified: Secondary | ICD-10-CM

## 2012-10-02 DIAGNOSIS — F411 Generalized anxiety disorder: Secondary | ICD-10-CM

## 2012-10-02 DIAGNOSIS — M79672 Pain in left foot: Secondary | ICD-10-CM

## 2012-10-02 DIAGNOSIS — R079 Chest pain, unspecified: Secondary | ICD-10-CM

## 2012-10-02 DIAGNOSIS — E559 Vitamin D deficiency, unspecified: Secondary | ICD-10-CM | POA: Insufficient documentation

## 2012-10-02 DIAGNOSIS — F419 Anxiety disorder, unspecified: Secondary | ICD-10-CM

## 2012-10-02 DIAGNOSIS — E7800 Pure hypercholesterolemia, unspecified: Secondary | ICD-10-CM

## 2012-10-02 NOTE — Assessment & Plan Note (Signed)
Uncontrolled; tolerating two Fish Oil daily and low-carbohydrate food intake.

## 2012-10-02 NOTE — Assessment & Plan Note (Signed)
New.  Continue Vitamin D 1500 units daily; will repeat levels at next visit in two months.

## 2012-10-02 NOTE — Assessment & Plan Note (Signed)
Improved/resolved with treatment of anxiety/depression.  No longer warranting Clonazepam.

## 2012-10-02 NOTE — Progress Notes (Signed)
[address]   [place], [place]  [address]   [phone]  Subjective:    Patient ID: Brian Houston, male    DOB: 1956/07/01, 57 y.o.   MRN: [id number]  HPI This 57 y.o. male presents for one month follow-up and evaluation of the following:  1.  Anxiety and depression: one month follow-up; management at last visit included adding Zoloft 50mg  daily. Much improvement since last visit.  Still awakens with dread upon awakening but much less severe and short lived; spontaneously resolves.  Stopped Clonazepam all together; stopped one week ago.  Clonazepam was depressing pt; making symptoms worse. Started feeling worse with scheduled Clonazepam.  75-100% improved from last visit.  Constant worry at all times.  No side effects to Zoloft; do get a sour taste in mouth after ingesting Zoloft; taking at night.  Noticed improvement two weeks after starting Zoloft.  No SI/HI.  Sleeping very well.  2. Chest pain: now resolved.  S/p stress testing by [facility] and low risk without abnormalities.  Dr. Nadara Eaton did find vitamin D deficiency; started supplementation one week ago.  Pepcid stopped at end of February.  3.  Vitamin D deficiency: 1500 units daily for Vitamin D level of 21.  Started supplementation 09/21/12.  No side effects to medication.  4. Hypercholesterolemia:  S/p Lipoprofile by [employer]; started two fish oil and advised to avoid sugars.    5. Insomnia: resolved two weeks into Zoloft.  No longer taking Clonazepam.  6. Plantar Fasciitis: much improved with icing and supportive shoes.      Review of Systems  Constitutional: Negative for fever, chills, diaphoresis and fatigue.  Respiratory: Negative for cough, shortness of breath and wheezing.   Cardiovascular: Negative for chest pain, palpitations and leg swelling.  Musculoskeletal: Positive for myalgias, arthralgias and gait problem. Negative for joint swelling.  Neurological: Negative for dizziness, tremors, syncope, speech difficulty, weakness,  light-headedness, numbness and headaches.  Psychiatric/Behavioral: Positive for dysphoric mood. Negative for suicidal ideas, confusion, sleep disturbance, self-injury, decreased concentration and agitation. The patient is nervous/anxious.         Past Medical History  Diagnosis Date  . Depression   . Anxiety     Past Surgical History  Procedure Laterality Date  . Appendectomy    . Cholecystectomy      Prior to Admission medications   Medication Sig Start Date End Date Taking? Authorizing Provider  cholecalciferol (VITAMIN D) 1000 UNITS tablet Take 1,000 Units by mouth daily.   Yes Historical Provider, MD  ibuprofen (ADVIL,MOTRIN) 200 MG tablet Take 400 mg by mouth every 6 (six) hours as needed. For pain   Yes Historical Provider, MD  Multiple Vitamin (MULTIVITAMIN) tablet Take 1 tablet by mouth daily.   Yes Historical Provider, MD  sertraline (ZOLOFT) 50 MG tablet Take 1 tablet (50 mg total) by mouth daily. 08/30/12  Yes Ethelda Chick, MD  clonazePAM Scarlette Calico) 0.5 MG tablet Take 1/2 as needed once daily for anxiety 02/18/12   Peyton Najjar, MD    Allergies  Allergen Reactions  . Remeron (Mirtazapine)   . Omeprazole Rash  . Penicillins Rash    History   Social History  . Marital Status: Married    Spouse Name: N/A    Number of Children: N/A  . Years of Education: N/A   Occupational History  . Not on file.   Social History Main Topics  . Smoking status: Never Smoker   . Smokeless tobacco: Not on file  .  Alcohol Use: No  . Drug Use: No  . Sexually Active: No   Other Topics Concern  . Not on file   Social History Narrative   Marital status: married x 38 years.      Children: three children; three grandchildren      Lives: with wife, middle daughter.      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    Family History  Problem Relation Age of Onset  . Anxiety disorder Mother   . Stroke Father 61  . COPD Father   . Hypertension Father   .  Diabetes Father   . Heart disease Father     heart valve replacement.  . Anxiety disorder Brother   . Anxiety disorder Daughter   . Anxiety disorder Maternal Grandmother   . Diabetes Paternal Grandfather     Objective:   Physical Exam  Nursing note and vitals reviewed. Constitutional: He is oriented to person, place, and time. He appears well-developed and well-nourished. No distress.  HENT:  Mouth/Throat: Oropharynx is clear and moist.  Eyes: Conjunctivae and EOM are normal. Pupils are equal, round, and reactive to light.  Neck: Normal range of motion. Neck supple. No JVD present. No thyromegaly present.  Cardiovascular: Normal rate, regular rhythm and normal heart sounds.  Exam reveals no gallop and no friction rub.   No murmur heard. Pulmonary/Chest: Effort normal and breath sounds normal. He has no wheezes. He has no rales.  Lymphadenopathy:    He has no cervical adenopathy.  Neurological: He is alert and oriented to person, place, and time. No cranial nerve deficit. He exhibits normal muscle tone. Coordination normal.  Skin: He is not diaphoretic.  Psychiatric: He has a normal mood and affect. His behavior is normal. Judgment and thought content normal.          Assessment & Plan:

## 2012-10-02 NOTE — Patient Instructions (Addendum)
Anxiety  Insomnia  Chest pain  Unspecified vitamin D deficiency  Pure hypercholesterolemia

## 2012-10-02 NOTE — Assessment & Plan Note (Signed)
Significant improvement with Zoloft 50mg  daily; no changes to management at this time; follow-up in two months.  To call if anxiety worsens; will increase dose to 100mg  Zoloft before next visit if needed.

## 2012-10-02 NOTE — Assessment & Plan Note (Signed)
Improving with icing, supportive shoe wear.

## 2012-10-02 NOTE — Assessment & Plan Note (Signed)
Improved/resolved with treatment of anxiety/depression.

## 2012-10-03 ENCOUNTER — Ambulatory Visit: Payer: PRIVATE HEALTH INSURANCE | Admitting: Family Medicine

## 2012-11-23 ENCOUNTER — Encounter: Payer: Self-pay | Admitting: Family Medicine

## 2012-11-28 ENCOUNTER — Encounter: Payer: Self-pay | Admitting: Family Medicine

## 2012-11-28 ENCOUNTER — Ambulatory Visit (INDEPENDENT_AMBULATORY_CARE_PROVIDER_SITE_OTHER): Payer: PRIVATE HEALTH INSURANCE | Admitting: Family Medicine

## 2012-11-28 VITALS — BP 102/64 | HR 60 | Temp 98.5°F | Resp 16 | Ht 71.0 in | Wt 179.0 lb

## 2012-11-28 DIAGNOSIS — M79609 Pain in unspecified limb: Secondary | ICD-10-CM

## 2012-11-28 DIAGNOSIS — M79673 Pain in unspecified foot: Secondary | ICD-10-CM

## 2012-11-28 DIAGNOSIS — F419 Anxiety disorder, unspecified: Secondary | ICD-10-CM

## 2012-11-28 DIAGNOSIS — E7800 Pure hypercholesterolemia, unspecified: Secondary | ICD-10-CM

## 2012-11-28 DIAGNOSIS — F411 Generalized anxiety disorder: Secondary | ICD-10-CM

## 2012-11-28 DIAGNOSIS — E785 Hyperlipidemia, unspecified: Secondary | ICD-10-CM

## 2012-11-28 DIAGNOSIS — E559 Vitamin D deficiency, unspecified: Secondary | ICD-10-CM

## 2012-11-28 LAB — BASIC METABOLIC PANEL
BUN: 16 mg/dL (ref 6–23)
CO2: 28 mEq/L (ref 19–32)
Chloride: 108 mEq/L (ref 96–112)
Creat: 0.94 mg/dL (ref 0.50–1.35)
Glucose, Bld: 95 mg/dL (ref 70–99)
Potassium: 4.3 mEq/L (ref 3.5–5.3)

## 2012-11-28 LAB — LIPID PANEL
HDL: 47 mg/dL (ref >39)
LDL Cholesterol: 126 mg/dL — ABNORMAL HIGH (ref 0–99)
Total CHOL/HDL Ratio: 4 Ratio
Triglycerides: 70 mg/dL (ref <150)
VLDL: 14 mg/dL (ref 0–40)

## 2012-11-28 MED ORDER — SERTRALINE HCL 50 MG PO TABS: 50.0000 mg | ORAL_TABLET | Freq: Every day | ORAL | Status: DC

## 2012-11-28 NOTE — Progress Notes (Signed)
[address]   [place], [place]  [address]   [phone]  Subjective:    Patient ID: Brian Houston, male    DOB: Oct 09, 1955, 57 y.o.   MRN: [id number]  HPI This 57 y.o. male presents for two month follow-up:  1.  Anxiety/depression: two month follow-up; no changes to management at last visit; reports compliance with Zoloft; good tolerance to medication; good symptom control.  Mild fatigue, hypersomnolence with Zoloft; also craves chocolate dark.  Weight actually down five pounds since last visit.  No SI/HI.  Sleeping moderately well; has some insomnia.  Has some moments brief of euphoria.     2.  Vitamin D deficiency:  Three month follow-up; due for repeat labs; compliance with daily Vitamin D supplementation.  Feeling well.  3.  Hypercholesterolemia:  Fasting; taking Fish Oil two daily 1000mg  each.  Started Fish Oil after cardiology appointment; three months ago.  No formal exercise but walking to work.  4.  Plantar Fasciitis:  Still persistent; no icing recently.  L heel with numb spot but intermittent.  No pain in mornings.     Review of Systems  Constitutional: Negative for fever, chills, diaphoresis and fatigue.  Respiratory: Negative for shortness of breath.   Cardiovascular: Negative for chest pain, palpitations and leg swelling.  Musculoskeletal: Positive for myalgias, arthralgias and gait problem. Negative for joint swelling.  Neurological: Negative for dizziness, weakness, light-headedness, numbness and headaches.  Psychiatric/Behavioral: Positive for sleep disturbance. Negative for suicidal ideas, self-injury, dysphoric mood and decreased concentration. The patient is not nervous/anxious.         Past Medical History  Diagnosis Date  . Depression   . Anxiety     Past Surgical History  Procedure Laterality Date  . Appendectomy    . Cholecystectomy    . Stress testing  09/16/2012    low risk for ischemia. Ganji.  Symptoms: chest pain, SOB.    Prior to Admission  medications   Medication Sig Start Date End Date Taking? Authorizing Provider  cholecalciferol (VITAMIN D) 1000 UNITS tablet Take 1,000 Units by mouth daily.   Yes Historical Provider, MD  clonazePAM (KLONOPIN) 0.5 MG tablet Take 1/2 as needed once daily for anxiety 02/18/12  Yes Peyton Najjar, MD  famotidine (PEPCID) 40 MG tablet Take 40 mg by mouth daily.   Yes Historical Provider, MD  fish oil-omega-3 fatty acids 1000 MG capsule Take 2 g by mouth daily.   Yes Historical Provider, MD  ibuprofen (ADVIL,MOTRIN) 200 MG tablet Take 400 mg by mouth every 6 (six) hours as needed. For pain   Yes Historical Provider, MD  Multiple Vitamin (MULTIVITAMIN) tablet Take 1 tablet by mouth daily.   Yes Historical Provider, MD  sertraline (ZOLOFT) 50 MG tablet Take 1 tablet (50 mg total) by mouth daily. 11/28/12  Yes Ethelda Chick, MD    Allergies  Allergen Reactions  . Remeron (Mirtazapine)   . Omeprazole Rash  . Penicillins Rash    History   Social History  . Marital Status: Married    Spouse Name: N/A    Number of Children: 3  . Years of Education: N/A   Occupational History  . PASTOR    Social History Main Topics  . Smoking status: Never Smoker   . Smokeless tobacco: Not on file  . Alcohol Use: No  . Drug Use: No  . Sexually Active: No   Other Topics Concern  . Not on file   Social History Narrative  Marital status: married x 38 years.      Children: three children; three grandchildren      Lives: with wife, middle daughter.      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      Exercise: no formal exercise program; walks to work.    Family History  Problem Relation Age of Onset  . Anxiety disorder Mother   . Stroke Father 42  . COPD Father   . Hypertension Father   . Diabetes Father   . Heart disease Father     heart valve replacement.  . Anxiety disorder Brother   . Anxiety disorder Daughter   . Anxiety disorder Maternal Grandmother   . Diabetes  Paternal Grandfather     Objective:   Physical Exam  Nursing note and vitals reviewed. Constitutional: He is oriented to person, place, and time. He appears well-developed and well-nourished. No distress.  Eyes: Conjunctivae are normal. Pupils are equal, round, and reactive to light.  Cardiovascular: Normal rate, regular rhythm and normal heart sounds.   Pulmonary/Chest: Effort normal and breath sounds normal. He has no wheezes. He has no rales.  Neurological: He is alert and oriented to person, place, and time. No cranial nerve deficit. He exhibits normal muscle tone. Coordination normal.  Skin: He is not diaphoretic.  Psychiatric: He has a normal mood and affect. His behavior is normal. Judgment and thought content normal.       Assessment & Plan:  Unspecified vitamin D deficiency - Plan: Vitamin D 25 hydroxy, Basic metabolic panel  Anxiety - Plan: sertraline (ZOLOFT) 50 MG tablet  Heel pain, unspecified laterality  Other and unspecified hyperlipidemia - Plan: Lipid panel    1. Anxiety with depression: controlled; no changes to management at this time.  Refill provided. 2.  Insomnia:  Persistent but improved; no treatment at this time other than treating anxiety. 3.  Hyperlipidemia: uncontrolled; tolerating Fish Oil 2000mg  daily; obtain lipid panel; weight down five pounds; exercising daily. 4.  Vitamin D deficiency: uncontrolled; s/p three months of Vitamin D supplementation; obtain labs. 5.  Feet pain B: worsening since last visit; recommend icing qhs for next two weeks.  To call if desires podiatry referral.     Meds ordered this encounter  Medications  . fish oil-omega-3 fatty acids 1000 MG capsule    Sig: Take 2 g by mouth daily.  . famotidine (PEPCID) 40 MG tablet    Sig: Take 40 mg by mouth daily.  . sertraline (ZOLOFT) 50 MG tablet    Sig: Take 1 tablet (50 mg total) by mouth daily.    Dispense:  30 tablet    Refill:  5

## 2012-12-02 ENCOUNTER — Encounter: Payer: Self-pay | Admitting: Family Medicine

## 2012-12-04 ENCOUNTER — Ambulatory Visit: Payer: PRIVATE HEALTH INSURANCE | Admitting: Family Medicine

## 2013-01-12 ENCOUNTER — Ambulatory Visit (INDEPENDENT_AMBULATORY_CARE_PROVIDER_SITE_OTHER): Payer: PRIVATE HEALTH INSURANCE | Admitting: Family Medicine

## 2013-01-12 VITALS — BP 111/71 | HR 71 | Temp 98.1°F | Resp 16 | Ht 72.0 in | Wt 179.0 lb

## 2013-01-12 DIAGNOSIS — H9209 Otalgia, unspecified ear: Secondary | ICD-10-CM

## 2013-01-12 DIAGNOSIS — M79672 Pain in left foot: Secondary | ICD-10-CM

## 2013-01-12 DIAGNOSIS — M79609 Pain in unspecified limb: Secondary | ICD-10-CM

## 2013-01-12 DIAGNOSIS — D2320 Other benign neoplasm of skin of unspecified ear and external auricular canal: Secondary | ICD-10-CM

## 2013-01-12 DIAGNOSIS — L989 Disorder of the skin and subcutaneous tissue, unspecified: Secondary | ICD-10-CM

## 2013-01-12 NOTE — Patient Instructions (Addendum)
Morton's Neuroma in Sports  (Interdigital Plantar Neuroma) Morton's neuroma is a condition of the nervous system that results in pain or loss of feeling in the toes. The disease is caused by the bones of the foot squeezing the nerve that runs between two toes (interdigital nerve). The third and fourth toes are most likely to be affected by this disease. SYMPTOMS   Tingling, numbness, burning, or electric shocks in the front of the foot, often involving the third and fourth toes, although it may involve any other pair of toes.  Pain and tenderness in the front of the foot, that gets worse when walking.  Pain that gets worse when pressure is applied to the foot (wearing shoes).  Severe pain in the front of the foot, when standing on the front of the foot (on tiptoes), such as with running, jumping, pivoting, or dancing. CAUSES  Morton's neuroma is caused by swelling of the nerve between two toes. This swelling causes the nerve to be pinched between the bones of the foot. RISK INCREASES WITH:  Recurring foot or ankle injuries.  Poor fitting or worn shoes, with minimal padding and shock absorbers.  Loose ligaments of the foot, causing thickening of the nerve.  Poor foot strength and flexibility. PREVENTION  Warm up and stretch properly before activity.  Maintain physical fitness:  Foot and ankle flexibility.  Muscle strength and endurance.  Cardiovascular fitness.  Wear properly fitted and padded shoes.  Wear arch supports (orthotics), when needed. PROGNOSIS  If treated properly, Morton's neuroma can usually be cured with non-surgical treatment. For certain cases, surgery may be needed. RELATED COMPLICATIONS  Permanent numbness and pain in the foot.  Inability to participate in athletics, because of pain. TREATMENT Treatment first involves stopping any activities that make the symptoms worse. The use of ice and medicine will help reduce pain and inflammation. Wearing shoes  with a wide toe box, and an orthotic arch support or metatarsal bar, may also reduce pain. Your caregiver may give you a corticosteroid injection, to further reduce inflammation. If non-surgical treatment is unsuccessful, surgery may be needed. Surgery to fix Morton's neuroma is often performed as an outpatient procedure, meaning you can go home the same day as the surgery. The procedure involves removing the source of pressure on the nerve. If it is necessary to remove the nerve, you can expect persistent numbness. MEDICATION  If pain medicine is needed, nonsteroidal anti-inflammatory medicines (aspirin and ibuprofen), or other minor pain relievers (acetaminophen), are often advised.  Do not take pain medicine for 7 days before surgery.  Prescription pain relievers are usually prescribed only after surgery. Use only as directed and only as much as you need.  Corticosteroid injections are used in extreme cases, to reduce inflammation. These injections should be done only if necessary, because they may be given only a limited number of times. HEAT AND COLD  Cold treatment (icing) should be applied for 10 to 15 minutes every 2 to 3 hours for inflammation and pain, and immediately after activity that aggravates your symptoms. Use ice packs or an ice massage.  Heat treatment may be used before performing stretching and strengthening activities prescribed by your caregiver, physical therapist, or athletic trainer. Use a heat pack or a warm water soak. SEEK MEDICAL CARE IF:   Symptoms get worse or do not improve in 2 weeks, despite treatment.  After surgery you develop increasing pain, swelling, redness, increased warmth, bleeding, drainage of fluids, or fever.  New, unexplained symptoms develop. (  Drugs used in treatment may produce side effects.) Document Released: 05/26/2005 Document Revised: 10/11/2011 Document Reviewed: 10/31/2008 Exit[employer] Patient Information 2014 [employer].  

## 2013-01-12 NOTE — Progress Notes (Signed)
[facility]:  Office Visit  Chief Complaint:  Chief Complaint  Patient presents with  . Nevus    would like it removed, behind left ear    HPI: Brian Houston is a 57 y.o. male who complains of:  1. Skin tag near left ear growing bigger and is irritating him when he wears glasses x 3 months or longer 2. Right foot 2nd toe pain when he walks x 5 weeks. Btwn 2 and 3. Numbness, burning, pain. Has not tried anything. Hurts with tight shoes. 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   Past Medical History  Diagnosis Date  . Depression   . Anxiety    Past Surgical History  Procedure Laterality Date  . Appendectomy    . Cholecystectomy    . Stress testing  09/16/2012    low risk for ischemia. [place]ji.  Symptoms: chest pain, SOB.   History   Social History  . Marital Status: Married    Spouse Name: N/A    Number of Children: 3  . Years of Education: N/A   Occupational History  . PASTOR    Social History Main Topics  . Smoking status: Never Smoker   . Smokeless tobacco: None  . Alcohol Use: No  . Drug Use: No  . Sexually Active: No   Other Topics Concern  . None   Social History Narrative   Marital status: married x 38 years.      Children: three children; three grandchildren      Lives: with wife, middle daughter.      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      Exercise: no formal exercise program; walks to work.   Family History  Problem Relation Age of Onset  . Anxiety disorder Mother   . Stroke Father 71  . COPD Father   . Hypertension Father   . Diabetes Father   . Heart disease Father     heart valve replacement.  . Anxiety disorder Brother   . Anxiety disorder Daughter   . Anxiety disorder Maternal Grandmother   . Diabetes Paternal Grandfather    Allergies  Allergen Reactions  . Remeron (Mirtazapine)   . Omeprazole Rash  . Penicillins Rash   Prior to Admission medications   Medication Sig Start Date End Date  Taking? Authorizing Provider  cholecalciferol (VITAMIN D) 1000 UNITS tablet Take 1,000 Units by mouth daily.   Yes Historical Provider, MD  clonazePAM (KLONOPIN) 0.5 MG tablet Take 1/2 as needed once daily for anxiety 02/18/12  Yes Peyton Najjar, MD  famotidine (PEPCID) 40 MG tablet Take 40 mg by mouth daily.   Yes Historical Provider, MD  fish oil-omega-3 fatty acids 1000 MG capsule Take 2 g by mouth daily.   Yes Historical Provider, MD  ibuprofen (ADVIL,MOTRIN) 200 MG tablet Take 400 mg by mouth every 6 (six) hours as needed. For pain   Yes Historical Provider, MD  Multiple Vitamin (MULTIVITAMIN) tablet Take 1 tablet by mouth daily.   Yes Historical Provider, MD  sertraline (ZOLOFT) 50 MG tablet Take 1 tablet (50 mg total) by mouth daily. 11/28/12  Yes Ethelda Chick, MD     ROS: The patient denies fevers, chills, night sweats, unintentional weight loss, chest pain, palpitations, wheezing, dyspnea on exertion, nausea, vomiting, abdominal pain, dysuria, hematuria, melena,   All other systems have been reviewed and were otherwise negative with the exception of those mentioned in the HPI  and as above.    PHYSICAL EXAM: Filed Vitals:   01/12/13 1232  BP: 111/71  Pulse: 71  Temp: 98.1 F (36.7 C)  Resp: 16   Filed Vitals:   01/12/13 1232  Height: 6' (1.829 m)  Weight: 179 lb (81.194 kg)   Body mass index is 24.27 kg/(m^2).  General: Alert, no acute distress HEENT:  Normocephalic, atraumatic, oropharynx patent.  Cardiovascular:  Regular rate and rhythm, no rubs murmurs or gallops.  No Carotid bruits, radial pulse intact. No pedal edema.  Respiratory: Clear to auscultation bilaterally.  No wheezes, rales, or rhonchi.  No cyanosis, no use of accessory musculature GI: No organomegaly, abdomen is soft and non-tender, positive bowel sounds.  No masses. Skin: 1/4 inch left skin tag, benign appearing Neurologic: Facial musculature symmetric. Psychiatric: Patient is appropriate throughout  our interaction. Lymphatic: No cervical lymphadenopathy Musculoskeletal: Gait intact. + tenderness between 2 nad 3rd interdigit area of right foot.  5/5 strength, 2/2 DTRS, + DP   LABS:    EKG/XRAY:   Primary read interpreted by Dr. Conley Rolls at [facility].   ASSESSMENT/PLAN: Encounter Diagnoses  Name Primary?  Marland Kitchen Foot pain, left Yes  . 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

## 2013-01-12 NOTE — Progress Notes (Signed)
Procedure Note: Verbal consent obtained from the patient.  Local anesthesia with 0.5cc 2% plain lidocaine.  Betadine prep.  Skin tag excised with 15 blade.  Hemostasis obtained with direct pressure and silver nitrate.  Dressing applied.  Pt tolerated well.  No need for further follow up unless concerns arise.

## 2013-02-16 ENCOUNTER — Telehealth: Payer: Self-pay

## 2013-02-16 DIAGNOSIS — F419 Anxiety disorder, unspecified: Secondary | ICD-10-CM

## 2013-02-16 NOTE — Telephone Encounter (Signed)
Patient states he would like to have his Zoloft increased from 50mg  to 100 mg. [facility] on [address].  [phone].

## 2013-02-18 MED ORDER — SERTRALINE HCL 100 MG PO TABS: 100.0000 mg | ORAL_TABLET | Freq: Every day | ORAL | Status: DC

## 2013-02-18 NOTE — Telephone Encounter (Signed)
Call--- sent in rx for Zoloft 100mg  daily to pharmacy.

## 2013-02-18 NOTE — Telephone Encounter (Signed)
lmom that rx was sent to the pharmacy.

## 2013-03-14 ENCOUNTER — Encounter: Payer: Self-pay | Admitting: Family Medicine

## 2013-04-03 ENCOUNTER — Ambulatory Visit: Payer: PRIVATE HEALTH INSURANCE | Admitting: Family Medicine

## 2013-04-26 ENCOUNTER — Ambulatory Visit (INDEPENDENT_AMBULATORY_CARE_PROVIDER_SITE_OTHER): Payer: PRIVATE HEALTH INSURANCE | Admitting: Family Medicine

## 2013-04-26 ENCOUNTER — Encounter: Payer: Self-pay | Admitting: Family Medicine

## 2013-04-26 VITALS — BP 90/62 | HR 61 | Temp 98.8°F | Resp 16 | Ht 71.0 in | Wt 183.4 lb

## 2013-04-26 DIAGNOSIS — F419 Anxiety disorder, unspecified: Secondary | ICD-10-CM

## 2013-04-26 DIAGNOSIS — F411 Generalized anxiety disorder: Secondary | ICD-10-CM

## 2013-04-26 DIAGNOSIS — G4700 Insomnia, unspecified: Secondary | ICD-10-CM

## 2013-04-26 MED ORDER — CLONAZEPAM 0.5 MG PO TABS: ORAL_TABLET | ORAL | Status: DC

## 2013-04-26 NOTE — Progress Notes (Signed)
[address]   [place], [place]  [address]   [phone]  Subjective:    Patient ID: Brian Houston, male    DOB: [birth year], 57 y.o.   MRN: [id number]  HPI This 57 y.o. male presents for five month follow-up for the following:  1.  Anxiety and depression:  Worsened after last visit; called office; Zoloft increased to 100mg  daily.  Suffered with a major mood swing.  Now doing much better emotionally.  Zoloft controls depression symptoms well and treats daily anxiety well.  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  No SI/HI.  Sold motorcycle this week; plans to buy a smaller bike.  Thing stable at home and at church.    2. Insomnia: sporadic issues; sleeps poorly one night and then sleeps well the following night.  3.  Foot pain R: improved; intermittent issue; s/p evaluation by Dr. Conley Rolls in 12/2012; provided with metatarsal cookie; wore for a while; no longer wearing.   Review of Systems  Constitutional: Negative for fever, chills, diaphoresis and fatigue.  Cardiovascular: Negative for chest pain.  Neurological: Negative for dizziness, speech difficulty, numbness and headaches.  Psychiatric/Behavioral: Positive for sleep disturbance and dysphoric mood. Negative for suicidal ideas and self-injury. The patient is nervous/anxious.    Past Medical History  Diagnosis Date  . Depression   . Anxiety   . Allergy    Allergies  Allergen Reactions  . Remeron [Mirtazapine]   . Omeprazole Rash  . Penicillins Rash   Current Outpatient Prescriptions on File Prior to Visit  Medication Sig Dispense Refill  . fish oil-omega-3 fatty acids 1000 MG capsule Take 2 g by mouth daily.      Marland Kitchen ibuprofen (ADVIL,MOTRIN) 200 MG tablet Take 400 mg by mouth every 6 (six) hours as needed. For pain      . Multiple Vitamin (MULTIVITAMIN) tablet Take 1 tablet by mouth daily.       . sertraline (ZOLOFT) 100 MG tablet Take 1 tablet (100 mg total) by mouth daily.  30 tablet  5   No current facility-administered medications on file prior to visit.   History   Social History  . Marital Status: Married    Spouse Name: N/A    Number of Children: 3  . Years of Education: N/A   Occupational History  . PASTOR    Social History Main Topics  . Smoking status: Never Smoker   . Smokeless tobacco: Not on file  . Alcohol Use: No  . Drug Use: No  . Sexual Activity: No   Other Topics Concern  . Not on file   Social History Narrative   Marital status: married x 38 years.      Children: three children; three grandchildren      Lives: with wife, middle daughter.      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      Exercise: no formal exercise program; walks to work.       Objective:   Physical Exam  Nursing note and vitals reviewed. Constitutional: He is oriented to person, place, and time. He appears well-developed and well-nourished. No distress.  Eyes: Conjunctivae are normal. Pupils are equal, round, and reactive to light.  Neck: Neck supple.  Cardiovascular: Normal rate and regular rhythm.   No murmur heard. Pulmonary/Chest: Effort normal and breath sounds normal.  Neurological: He is  alert and oriented to person, place, and time. No cranial nerve deficit. He exhibits normal muscle tone. Coordination normal.  Skin: He is not diaphoretic.  Psychiatric: He has a normal mood and affect. His behavior is normal. Judgment and thought content normal.       Assessment & Plan:  Anxiety - Plan: clonazePAM (KLONOPIN) 0.5 MG tablet  Insomnia  1. Anxiety: worsening; increasing Zoloft to 100mg  with recent improvement; now using Klonopin twice weekly on average with good control of anxiety; no changes in medication; refill of Clonazepam provided. 2.  Insomnia:  Uncontrolled but does not desire further therapy.    Meds ordered this encounter    Medications  . clonazePAM (KLONOPIN) 0.5 MG tablet    Sig: Take 1/2 as needed once daily for anxiety    Dispense:  30 tablet    Refill:  3

## 2013-06-07 ENCOUNTER — Other Ambulatory Visit: Payer: Self-pay

## 2013-08-29 ENCOUNTER — Other Ambulatory Visit: Payer: Self-pay | Admitting: Family Medicine

## 2013-08-29 ENCOUNTER — Ambulatory Visit (INDEPENDENT_AMBULATORY_CARE_PROVIDER_SITE_OTHER): Payer: BC Managed Care – PPO | Admitting: Family Medicine

## 2013-08-29 ENCOUNTER — Encounter: Payer: Self-pay | Admitting: Family Medicine

## 2013-08-29 VITALS — BP 103/67 | HR 67 | Temp 98.4°F | Resp 16 | Ht 71.5 in | Wt 183.0 lb

## 2013-08-29 DIAGNOSIS — L989 Disorder of the skin and subcutaneous tissue, unspecified: Secondary | ICD-10-CM

## 2013-08-29 DIAGNOSIS — Z0000 Encounter for general adult medical examination without abnormal findings: Secondary | ICD-10-CM

## 2013-08-29 DIAGNOSIS — N410 Acute prostatitis: Secondary | ICD-10-CM

## 2013-08-29 DIAGNOSIS — F419 Anxiety disorder, unspecified: Secondary | ICD-10-CM

## 2013-08-29 LAB — COMPLETE METABOLIC PANEL WITH GFR
ALBUMIN: 4.7 g/dL (ref 3.5–5.2)
ALT: 29 U/L (ref 0–53)
AST: 20 U/L (ref 0–37)
Alkaline Phosphatase: 60 U/L (ref 39–117)
BUN: 19 mg/dL (ref 6–23)
CALCIUM: 9.9 mg/dL (ref 8.4–10.5)
CHLORIDE: 101 meq/L (ref 96–112)
CO2: 30 meq/L (ref 19–32)
Creat: 1.04 mg/dL (ref 0.50–1.35)
GFR, Est Non African American: 79 mL/min
Glucose, Bld: 105 mg/dL — ABNORMAL HIGH (ref 70–99)
POTASSIUM: 4.9 meq/L (ref 3.5–5.3)
Sodium: 137 mEq/L (ref 135–145)
Total Bilirubin: 0.7 mg/dL (ref 0.2–1.2)
Total Protein: 7.7 g/dL (ref 6.0–8.3)

## 2013-08-29 LAB — POCT URINALYSIS DIPSTICK
BILIRUBIN UA: NEGATIVE
Blood, UA: NEGATIVE
GLUCOSE UA: NEGATIVE
KETONES UA: NEGATIVE
LEUKOCYTES UA: NEGATIVE
Nitrite, UA: NEGATIVE
PROTEIN UA: NEGATIVE
Urobilinogen, UA: 0.2
pH, UA: 5.5

## 2013-08-29 LAB — CBC WITH DIFFERENTIAL/PLATELET
BASOS ABS: 0 10*3/uL (ref 0.0–0.1)
Basophils Relative: 0 % (ref 0–1)
Eosinophils Absolute: 0.2 10*3/uL (ref 0.0–0.7)
Eosinophils Relative: 3 % (ref 0–5)
HEMATOCRIT: 48.6 % (ref 39.0–52.0)
HEMOGLOBIN: 17 g/dL (ref 13.0–17.0)
LYMPHS ABS: 2.5 10*3/uL (ref 0.7–4.0)
LYMPHS PCT: 36 % (ref 12–46)
MCH: 31.1 pg (ref 26.0–34.0)
MCHC: 35 g/dL (ref 30.0–36.0)
MCV: 88.8 fL (ref 78.0–100.0)
MONO ABS: 0.5 10*3/uL (ref 0.1–1.0)
Monocytes Relative: 8 % (ref 3–12)
NEUTROS ABS: 3.7 10*3/uL (ref 1.7–7.7)
Neutrophils Relative %: 53 % (ref 43–77)
Platelets: 340 10*3/uL (ref 150–400)
RBC: 5.47 MIL/uL (ref 4.22–5.81)
RDW: 14.2 % (ref 11.5–15.5)
WBC: 7 10*3/uL (ref 4.0–10.5)

## 2013-08-29 LAB — LIPID PANEL
CHOLESTEROL: 269 mg/dL — AB (ref 0–200)
HDL: 53 mg/dL (ref >39)
LDL Cholesterol: 195 mg/dL — ABNORMAL HIGH (ref 0–99)
Total CHOL/HDL Ratio: 5.1 Ratio
Triglycerides: 103 mg/dL (ref <150)
VLDL: 21 mg/dL (ref 0–40)

## 2013-08-29 LAB — IFOBT (OCCULT BLOOD): IMMUNOLOGICAL FECAL OCCULT BLOOD TEST: NEGATIVE

## 2013-08-29 MED ORDER — SERTRALINE HCL 100 MG PO TABS: 100.0000 mg | ORAL_TABLET | Freq: Every day | ORAL | Status: DC

## 2013-08-29 MED ORDER — CIPROFLOXACIN HCL 500 MG PO TABS: 500.0000 mg | ORAL_TABLET | Freq: Two times a day (BID) | ORAL | Status: DC

## 2013-08-29 MED ORDER — CLONAZEPAM 0.5 MG PO TABS: ORAL_TABLET | ORAL | Status: DC

## 2013-08-29 NOTE — Progress Notes (Addendum)
Subjective:   This chart was scribed for Brian Honour, MD by Brian Houston, [facility] Scribe. This patient was seen in room 21 and the patient's care was started 1:46 PM.   Patient ID: Brian Houston, male    DOB: [birth year], 58 y.o.   MRN: [id number]  HPI  HPI Comments: Brian Houston is a 58 y.o. Male with a PMHx of depression and anxiety who presents to [facility] seeking a physical examination today. Pt states he has been doing very well. He reports recent abdominal discomfort that he associates with not eating today. He states he currently uses his clonazepam as needed, typically twice a week. He also reports taking ibuprofen as needed. Otherwise, he reports taking his prescribed medications as directed with no difficulty. He reports having normal bowel movements. He reports waking up every morning around 7 AM, and goes to sleep around 12 AM to 1 AM.   Pt reports known allergies to penicillin, omeprazole, and remeron.   Pt has a PSHx of an appendectomy and cholecystectomy.   Denies HA, nausea, vomiting, dizziness, blurred vision, mouth sores, neck swelling, CP, SOB, cough, leg swelling, diarrhea, numbness, tingling, dysuria, frequency.  Pt reports constant tinnitus to his ears that initially started in 2000. He says the ringing is exacerbated when he takes his multivitamin temporarily. He also reports mild tenderness to sun spots on left side of his face. He reports intermittent palpitations. He reports mild pain to his prostate area after sitting for long periods of time. He reports having a prostate infection in the past, and states his current discomfort is similar to what he experienced with the infection. He states his wife says he stops breathing at night time at times. He reports having a normal sleep study performed in the past. He reports mild intermittent hip pain that is brought on by sitting for long periods of time. He reports mild heart  burn that is typically resolved with OTC medications. He reports having hemorrhoids in the past, but denies any itching or burning. He reports mild pain his left testicle posteriorly.   Pt states he is due for a Zoloft refill.  He reports being fairly active, and walks about 250 yards to and from work daily.    Mother is 43 years old, and has no major health issues with the exception of high cholesterol and anxiety. Father is 60 and suffers from COPD, diabetes, stroke, and heart valve replacement. He currently lives in [place] and is not very active at this time. His sister is in her 35's and suffers from anxiety. Brother is 55, and has early onset diabetes.   Pt has been married 17 years, and has 3 children (ages 103, 4, and 51), and 3 grandchildren.  Last eye examination: He states he is due for an eye exam this year Last Dermatology visit: He denies ever going to a dermatologist, but plans to follow up within the next few weeks as directed Last Tdap: Pt is unaware of last Tetanus shot; [facility] notes that TDAP administered 09/24/2010. Last date of dental work: He goes annually and has an appointment this coming April Last Flu Vaccination: He denies a Flu vaccination this season, and states prefers to not get vaccinations yearly Last colonoscopy: 03/2007, denies any polyps at visit. He is due for another visit when he turns 59 Last physical examination: Pt states he is unaware of his last physical   Review of Systems  Constitutional: Negative.   HENT: Positive for postnasal drip and tinnitus. Negative for congestion and ear pain.   Eyes: Negative for visual disturbance.  Respiratory: Negative for apnea, cough, choking, shortness of breath, wheezing and stridor.   Cardiovascular: Positive for palpitations. Negative for chest pain and leg swelling.  Gastrointestinal: Positive for abdominal pain and rectal pain. Negative for nausea, vomiting, diarrhea, constipation, blood in stool and anal  bleeding.  Genitourinary: Positive for decreased urine volume, difficulty urinating and testicular pain. Negative for dysuria, urgency, frequency and flank pain.       Nocturia x 0; difficulty getting stream started.  Musculoskeletal: Positive for arthralgias. Negative for back pain, joint swelling, myalgias, neck pain and neck stiffness.  Skin: Positive for color change. Negative for pallor, rash and wound.       Diffuse sun spots to the face  Neurological: Negative for dizziness, syncope, weakness, numbness and headaches.  Psychiatric/Behavioral: Negative for sleep disturbance, dysphoric mood and decreased concentration. The patient is not nervous/anxious.     Past Medical History  Diagnosis Date   Depression    Anxiety    Allergy     Past Surgical History  Procedure Laterality Date   Appendectomy     Cholecystectomy     Stress testing  09/16/2012    low risk for ischemia. Brian Houston.  Symptoms: chest pain, SOB.    History   Social History   Marital Status: Married    Spouse Name: N/A    Number of Children: 3   Years of Education: N/A   Occupational History   PASTOR    Social History Main Topics   Smoking status: Never Smoker    Smokeless tobacco: Not on file   Alcohol Use: No   Drug Use: No   Sexual Activity: No   Other Topics Concern   Not on file   Social History Narrative   Marital status: married x 38 years.      Children: three children; three grandchildren      Lives: with wife, middle daughter.      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      Exercise: no formal exercise program; walks to work.   Triage Vitals: BP 103/67   Pulse 67   Temp(Src) 98.4 F (36.9 C)   Resp 16   Ht 5' 11.5" (1.816 m)   Wt 183 lb (83.008 kg)   BMI 25.17 kg/m2   SpO2 95%   Objective:  Physical Exam  Nursing note and vitals reviewed. Constitutional: He is oriented to person, place, and time. He appears well-developed and well-nourished. No  distress.  HENT:  Head: Normocephalic and atraumatic.  Right Ear: External ear normal.  Left Ear: External ear normal.  Nose: Nose normal.  Mouth/Throat: Oropharynx is clear and moist. No oropharyngeal exudate.  Eyes: Conjunctivae and EOM are normal. Pupils are equal, round, and reactive to light.  Neck: Normal range of motion. Neck supple. Carotid bruit is not present. No thyromegaly present.  Cardiovascular: Normal rate, regular rhythm, normal heart sounds and intact distal pulses.  Exam reveals no gallop and no friction rub.   No murmur heard. Pulmonary/Chest: Effort normal and breath sounds normal. No respiratory distress. He has no wheezes. He has no rales.  Abdominal: Soft. Bowel sounds are normal. He exhibits no distension and no mass. There is no tenderness. There is no rebound and no guarding. Hernia confirmed negative in the right inguinal area and confirmed negative in the left inguinal  area.  Genitourinary: Testes normal and penis normal. Rectal exam shows external hemorrhoid. Rectal exam shows anal tone normal. Prostate is enlarged. Prostate is not tender. Right testis shows no mass, no swelling and no tenderness. Left testis shows no mass, no swelling and no tenderness. Circumcised.  Enlarged prostate but non tender, both lobes normal No swelling No nodules   Musculoskeletal: Normal range of motion. He exhibits no edema and no tenderness.  Lymphadenopathy:    He has no cervical adenopathy.       Right: No inguinal adenopathy present.       Left: No inguinal adenopathy present.  Neurological: He is alert and oriented to person, place, and time. He has normal reflexes. No cranial nerve deficit. He exhibits normal muscle tone. Coordination normal.  Skin: Skin is warm and dry. No rash noted. He is not diaphoretic.  Diffuse sun related changes on face  Psychiatric: He has a normal mood and affect. His behavior is normal. Judgment and thought content normal.    Results for orders  placed in visit on 08/29/13  POCT URINALYSIS DIPSTICK      Result Value Range   Color, UA yellow     Clarity, UA clear     Glucose, UA neg'     Bilirubin, UA neg     Ketones, UA neg     Spec Grav, UA >=1.030     Blood, UA neg     pH, UA 5.5     Protein, UA neg     Urobilinogen, UA 0.2     Nitrite, UA neg     Leukocytes, UA Negative    IFOBT (OCCULT BLOOD)      Result Value Range   IFOBT Negative       Assessment & Plan:  Annual physical exam - Plan: POCT urinalysis dipstick, CBC with Differential, COMPLETE METABOLIC PANEL WITH GFR, Vitamin B12, Vit D  25 hydroxy (rtn osteoporosis monitoring), Lipid panel, TSH, Hemoglobin A1c, IFOBT POC (occult bld, rslt in office)  Generalized skin lesions - Plan: Ambulatory referral to Dermatology  Anxiety - Plan: clonazePAM (KLONOPIN) 0.5 MG tablet, sertraline (ZOLOFT) 100 MG tablet  Prostatitis, acute - Plan: IFOBT POC (occult bld, rslt in office)  1.  CPE: Anticipatory guidance --- start ASA 81mg  daily.  Colonoscopy UTD: hemoccult negative in office. Immunizations UTD; pt declined flu vaccine.  Obtain labs. 2.  Generalized skin lesions:  New. Refer to dermatology for skin survey. 3.  Anxiety with depression: controlled; refill of Zoloft and Klonopin provided; follow-up six months. 4.  Acute prostatitis:  New. Mild.  Obtain PSA.  Rx for Cipro 500mg  bid x 2 weeks; call in two weeks if symptoms persists.  I personally performed the services described in this documentation, which was scribed in my presence.  The recorded information has been reviewed and is accurate.   Reginia Forts, M.D.  [facility] [place], [place]  [address] [phone] phone [phone] fax  Meds ordered this encounter  Medications   clonazePAM (KLONOPIN) 0.5 MG tablet    Sig: Take 1/2-1 as needed once daily for anxiety    Dispense:  30 tablet    Refill:  5   sertraline (ZOLOFT) 100 MG tablet    Sig: Take 1  tablet (100 mg total) by mouth daily.    Dispense:  30 tablet    Refill:  5   ciprofloxacin (CIPRO) 500 MG tablet    Sig:  Take 1 tablet (500 mg total) by mouth 2 (two) times daily.    Dispense:  30 tablet    Refill:  0

## 2013-08-29 NOTE — Patient Instructions (Signed)
1.  START ASPIRIN 81MG  ONE TABLET DAILY. 2.  WE WILL CALL YOU IN UPCOMING TWO WEEKS REGARDING DERMATOLOGY APPOINTMENT.

## 2013-08-29 NOTE — Telephone Encounter (Signed)
Dr Tamala Julian, you just saw pt for CPE today. Your notes weren't completed yet and I wanted to check that you still want pt on the med. I have pended for 6 mos.

## 2013-08-30 ENCOUNTER — Encounter: Payer: Self-pay | Admitting: Family Medicine

## 2013-08-30 ENCOUNTER — Telehealth: Payer: Self-pay

## 2013-08-30 LAB — HEMOGLOBIN A1C
HEMOGLOBIN A1C: 5.9 % — AB (ref <5.7)
Mean Plasma Glucose: 123 mg/dL — ABNORMAL HIGH (ref <117)

## 2013-08-30 LAB — TSH: TSH: 1.213 u[IU]/mL (ref 0.350–4.500)

## 2013-08-30 LAB — VITAMIN D 25 HYDROXY (VIT D DEFICIENCY, FRACTURES): Vit D, 25-Hydroxy: 51 ng/mL (ref 30–89)

## 2013-08-30 LAB — PSA: PSA: 2.64 ng/mL (ref <=4.00)

## 2013-08-30 LAB — VITAMIN B12: VITAMIN B 12: 722 pg/mL (ref 211–911)

## 2013-08-30 NOTE — Telephone Encounter (Signed)
Added PSA to patients labs, per Mervyn Skeeters

## 2013-09-05 ENCOUNTER — Encounter: Payer: Self-pay | Admitting: Family Medicine

## 2013-11-30 ENCOUNTER — Ambulatory Visit: Payer: BC Managed Care – PPO

## 2013-11-30 ENCOUNTER — Ambulatory Visit (INDEPENDENT_AMBULATORY_CARE_PROVIDER_SITE_OTHER): Payer: BC Managed Care – PPO | Admitting: Family Medicine

## 2013-11-30 VITALS — BP 110/70 | HR 63 | Temp 98.1°F | Resp 16 | Ht 71.2 in | Wt 184.6 lb

## 2013-11-30 DIAGNOSIS — M25539 Pain in unspecified wrist: Secondary | ICD-10-CM

## 2013-11-30 DIAGNOSIS — S5290XA Unspecified fracture of unspecified forearm, initial encounter for closed fracture: Secondary | ICD-10-CM

## 2013-11-30 DIAGNOSIS — M25531 Pain in right wrist: Secondary | ICD-10-CM

## 2013-11-30 DIAGNOSIS — IMO0002 Reserved for concepts with insufficient information to code with codable children: Secondary | ICD-10-CM

## 2013-11-30 DIAGNOSIS — S50311A Abrasion of right elbow, initial encounter: Secondary | ICD-10-CM

## 2013-11-30 DIAGNOSIS — S5291XA Unspecified fracture of right forearm, initial encounter for closed fracture: Secondary | ICD-10-CM

## 2013-11-30 HISTORY — PX: FRACTURE SURGERY: SHX138

## 2013-11-30 NOTE — Progress Notes (Signed)
Subjective:    Patient ID: Brian Houston, male    DOB: 1955-09-24, 58 y.o.   MRN: [id number]  11/30/2013  Wrist Pain   Wrist Pain  Associated symptoms include numbness. Pertinent negatives include no fever.   This 58 y.o. male presents for evaluation of R elbow and wrist pain.  In barn today, slipped on wet wood and landed on R elbow and wrist.  Pain and swelling at radial aspect of R wrist.  +tingling in fingers.  No cyanosis to fingers.  No icing.  No elevation. No NSAIDs or Tylenol.  Mild scratch on R elbow.  TDAP 2012.  Review of Systems  Constitutional: Negative for fever, chills, diaphoresis and fatigue.  Musculoskeletal: Positive for arthralgias, joint swelling and myalgias. Negative for back pain, gait problem, neck pain and neck stiffness.  Skin: Positive for wound.  Neurological: Positive for numbness. Negative for weakness.    Past Medical History  Diagnosis Date  . Depression   . Anxiety   . Allergy   . GERD (gastroesophageal reflux disease)    Allergies  Allergen Reactions  . Remeron [Mirtazapine]   . Omeprazole Rash  . Penicillins Rash   Current Outpatient Prescriptions  Medication Sig Dispense Refill  . clonazePAM (KLONOPIN) 0.5 MG tablet Take 1/2-1 as needed once daily for anxiety  30 tablet  5  . fish oil-omega-3 fatty acids 1000 MG capsule Take 2 g by mouth daily.      Marland Kitchen ibuprofen (ADVIL,MOTRIN) 200 MG tablet Take 400 mg by mouth every 6 (six) hours as needed. For pain      . Multiple Vitamin (MULTIVITAMIN) tablet Take 1 tablet by mouth daily.      . sertraline (ZOLOFT) 100 MG tablet TAKE 1 TABLET BY MOUTH DAILY  30 tablet  5   No current facility-administered medications for this visit.   History   Social History  . Marital Status: Married    Spouse Name: N/A    Number of Children: 3  . Years of Education: N/A   Occupational History  . PASTOR     [employer]   Social History Main Topics  . Smoking status: Never Smoker   . Smokeless  tobacco: Not on file  . Alcohol Use: No  . Drug Use: No  . Sexual Activity: No   Other Topics Concern  . Not on file   Social History Narrative   Marital status: married x 39 years.      Children: three children (35, 24, 22); three grandchildren.      Lives: with wife, middle daughter.  [facility] at [facility].        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      Exercise: no formal exercise program; walks to work (250 yards).      Seatbelts:  100%      Guns: none       Objective:    BP 110/70  Pulse 63  Temp(Src) 98.1 F (36.7 C) (Oral)  Resp 16  Ht 5' 11.2" (1.808 m)  Wt 184 lb 9.6 oz (83.734 kg)  BMI 25.62 kg/m2  SpO2 96% Physical Exam  Nursing note and vitals reviewed. Constitutional: He is oriented to person, place, and time. He appears well-developed and well-nourished. No distress.  HENT:  Head: Normocephalic and atraumatic.  Cardiovascular:  Pulses:      Radial pulses are 2+ on the right side.  Capillary refill < 3 seconds; hand cool but not cyanotic.  Musculoskeletal:       Right shoulder: Normal. He exhibits normal range of motion.       Right elbow: He exhibits laceration. He exhibits normal range of motion, no swelling, no effusion and no deformity. No tenderness found. No radial head, no medial epicondyle, no lateral epicondyle and no olecranon process tenderness noted.       Right wrist: He exhibits decreased range of motion, tenderness, bony tenderness and swelling. He exhibits no effusion, no crepitus, no deformity and no laceration.  R WRIST:  +TTP radial aspect of wrist; +mild swelling of wrist at radial aspect.  Decreased ROM all directions of wrist; no snuffbox TTP.  Pain with pronation and supination.   R HAND: no swelling; non-tender; full ROM all digits. R FOREARM: non-tender; no swelling;  +pain with pronation and supination at distal arm. R ELBOW: non-tender; no swelling; full extension and flexion.   Neurological: He is alert and  oriented to person, place, and time. No cranial nerve deficit or sensory deficit. He exhibits normal muscle tone.  Skin: He is not diaphoretic.  Mild superficial abrasion R elbow.  Psychiatric: His behavior is normal.   [facility] reading (PRIMARY) by  Dr. Tamala Julian.  R WRIST: +distal radial fracture.     Assessment & Plan:  Pain in right wrist - Plan: DG Wrist Complete Right  Closed right radial fracture  1. Pain R wrist radial aspect:  New. Recommend Tylenol every 4-6 hours as needed for pain. 2. . Closed R distal radial fracture:  New.  Sugar tong splint placed with sling; elevate, ice.  Refer to ortho for management. 3. Superficial abrasion R elbow:  New.  TDAP 2012; local wound care.  No orders of the defined types were placed in this encounter.    No Follow-up on file.   Reginia Forts, M.D.  [facility] [address] [place], [place]  [address] [phone] phone [phone] fax

## 2013-11-30 NOTE — Patient Instructions (Signed)
Radial Fracture You have a broken bone (fracture) of the forearm. This is the part of your arm between the elbow and your wrist. Your forearm is made up of two bones. These are the radius and ulna. Your fracture is in the radial shaft. This is the bone in your forearm located on the thumb side. A cast or splint is used to protect and keep your injured bone from moving. The cast or splint will be on generally for about 5 to 6 weeks, with individual variations. HOME CARE INSTRUCTIONS   Keep the injured part elevated while sitting or lying down. Keep the injury above the level of your heart (the center of the chest). This will decrease swelling and pain.  Apply ice to the injury for 15-20 minutes, 03-04 times per day while awake, for 2 days. Put the ice in a plastic bag and place a towel between the bag of ice and your cast or splint.  Move your fingers to avoid stiffness and minimize swelling.  If you have a plaster or fiberglass cast:  Do not try to scratch the skin under the cast using sharp or pointed objects.  Check the skin around the cast every day. You may put lotion on any red or sore areas.  Keep your cast dry and clean.  If you have a plaster splint:  Wear the splint as directed.  You may loosen the elastic around the splint if your fingers become numb, tingle, or turn cold or blue.  Do not put pressure on any part of your cast or splint. It may break. Rest your cast only on a pillow for the first 24 hours until it is fully hardened.  Your cast or splint can be protected during bathing with a plastic bag. Do not lower the cast or splint into water.  Only take over-the-counter or prescription medicines for pain, discomfort, or fever as directed by your caregiver. SEEK IMMEDIATE MEDICAL CARE IF:   Your cast gets damaged or breaks.  You have more severe pain or swelling than you did before getting the cast.  You have severe pain when stretching your fingers.  There is a bad  smell, new stains and/or pus-like (purulent) drainage coming from under the cast.  Your fingers or hand turn pale or blue and become cold or your loose feeling. Document Released: 12/30/2005 Document Revised: 10/11/2011 Document Reviewed: 03/28/2006 [facility] Patient Information 2014 [place].

## 2013-12-01 ENCOUNTER — Telehealth: Payer: Self-pay

## 2013-12-01 NOTE — Telephone Encounter (Signed)
Patient's wife called in to request a pain medication for her husband. She states her husband saw Dr. Tamala Julian yesterday for his broken arm and was advised to call the office if he decided he needed a prescription for pain meds. Please return call. Thank you.

## 2013-12-02 MED ORDER — HYDROCODONE-ACETAMINOPHEN 5-325 MG PO TABS: 1.0000 | ORAL_TABLET | Freq: Four times a day (QID) | ORAL | Status: DC | PRN

## 2013-12-02 NOTE — Telephone Encounter (Signed)
Patient called- rx in pick up drawer.

## 2013-12-02 NOTE — Telephone Encounter (Signed)
PT CALLED AGAIN REQUESTING PAIN MEDICINE. PLEASE CALL!

## 2013-12-02 NOTE — Telephone Encounter (Signed)
Rx for hydrocodone 5/325mg  written and ready for pick up.

## 2014-02-27 ENCOUNTER — Ambulatory Visit: Payer: BC Managed Care – PPO | Admitting: Family Medicine

## 2014-03-06 ENCOUNTER — Ambulatory Visit (INDEPENDENT_AMBULATORY_CARE_PROVIDER_SITE_OTHER): Payer: BC Managed Care – PPO | Admitting: Family Medicine

## 2014-03-06 ENCOUNTER — Encounter: Payer: Self-pay | Admitting: Family Medicine

## 2014-03-06 VITALS — BP 110/60 | HR 61 | Temp 97.8°F | Resp 16 | Ht 71.5 in | Wt 186.6 lb

## 2014-03-06 DIAGNOSIS — E7800 Pure hypercholesterolemia, unspecified: Secondary | ICD-10-CM

## 2014-03-06 DIAGNOSIS — F411 Generalized anxiety disorder: Secondary | ICD-10-CM

## 2014-03-06 DIAGNOSIS — R7309 Other abnormal glucose: Secondary | ICD-10-CM

## 2014-03-06 DIAGNOSIS — F419 Anxiety disorder, unspecified: Secondary | ICD-10-CM

## 2014-03-06 DIAGNOSIS — F341 Dysthymic disorder: Secondary | ICD-10-CM

## 2014-03-06 DIAGNOSIS — R7302 Impaired glucose tolerance (oral): Secondary | ICD-10-CM

## 2014-03-06 DIAGNOSIS — F418 Other specified anxiety disorders: Secondary | ICD-10-CM

## 2014-03-06 DIAGNOSIS — N419 Inflammatory disease of prostate, unspecified: Secondary | ICD-10-CM

## 2014-03-06 LAB — POCT UA - MICROSCOPIC ONLY
CASTS, UR, LPF, POC: NEGATIVE
Crystals, Ur, HPF, POC: NEGATIVE
Epithelial cells, urine per micros: NEGATIVE
MUCUS UA: NEGATIVE
RBC, URINE, MICROSCOPIC: NEGATIVE
WBC, Ur, HPF, POC: NEGATIVE
Yeast, UA: NEGATIVE

## 2014-03-06 LAB — HEMOGLOBIN A1C
Hgb A1c MFr Bld: 5.8 % — ABNORMAL HIGH (ref <5.7)
Mean Plasma Glucose: 120 mg/dL — ABNORMAL HIGH (ref <117)

## 2014-03-06 LAB — POCT URINALYSIS DIPSTICK
BILIRUBIN UA: NEGATIVE
Blood, UA: NEGATIVE
GLUCOSE UA: NEGATIVE
KETONES UA: NEGATIVE
Leukocytes, UA: NEGATIVE
NITRITE UA: NEGATIVE
PH UA: 7
Protein, UA: NEGATIVE
Spec Grav, UA: 1.015
Urobilinogen, UA: 0.2

## 2014-03-06 MED ORDER — SERTRALINE HCL 50 MG PO TABS: 50.0000 mg | ORAL_TABLET | Freq: Every day | ORAL | Status: DC

## 2014-03-06 MED ORDER — CLONAZEPAM 0.5 MG PO TABS: ORAL_TABLET | ORAL | Status: DC

## 2014-03-06 NOTE — Progress Notes (Signed)
Subjective:    Patient ID: Brian Houston, male    DOB: 19-Jul-1956, 58 y.o.   MRN: [id number]  HPI Chief Complaint  Patient presents with  . Follow-up    DEPRESSION WITH ANXIETY  . Depression  . Hyperlipidemia   This chart was scribed for [name], MD by Thea Alken, ED Scribe. This patient was seen in room 22 and the patient's care was started at 12:19 PM.  HPI Comments: Brian Houston is a 58 y.o. male who presents to the [facility] for a follow up regarding anxiety and depression. Pt states he has been doing well. Pt stopped take zoloft due to side effects.. Pt states nausea, dizziness, and shudder 8-10 times daily. He also reports "head weirdness" described as  "a pain without a pain". Emotionally he reports he is doing well. Pt takes clonazepam  as need usually saturday afternoons. He reports this medication makes him sleepy.  Pt denies sleep disturbances and activity changes. Pt denies being down, depressed and anxious.    Pt reports he has received a plate and 10 screws in right wrist. Pt states he has full function in wrist.  Pt has been seen by dermatologist. He denies skin cancers.  Pt reports an intermittent prostate flare up. Pt reports he does not notice this unless he is on his motorcycle. Symptoms are now resolved.  S/p PSA and urine at last visit.    When pt was seen during last visit he had an elevated cholesterol. He has been intolerant to multiple statins in the past.    Pt reports he walks for exercise.   Pt denies CP and reflux.   Past Medical History  Diagnosis Date  . Depression   . Anxiety   . Allergy   . GERD (gastroesophageal reflux disease)    Past Surgical History  Procedure Laterality Date  . Appendectomy    . Cholecystectomy    . Stress testing  09/16/2012    low risk for ischemia. Ganji.  Symptoms: chest pain, SOB.  . Polysomnography  08/02/1998    negative for OSA.  No treatment warranted.   Prior to Admission  medications   Medication Sig Start Date End Date Taking? Authorizing Provider  clonazePAM (KLONOPIN) 0.5 MG tablet Take 1/2-1 as needed once daily for anxiety 08/29/13  Yes [name], MD  fish oil-omega-3 fatty acids 1000 MG capsule Take 2 g by mouth daily.   Yes Historical Provider, MD  ibuprofen (ADVIL,MOTRIN) 200 MG tablet Take 400 mg by mouth every 6 (six) hours as needed. For pain   Yes Historical Provider, MD  sertraline (ZOLOFT) 100 MG tablet TAKE 1 TABLET BY MOUTH DAILY 08/29/13  Yes [name], MD  HYDROcodone-acetaminophen (NORCO/VICODIN) 5-325 MG per tablet Take 1 tablet by mouth every 6 (six) hours as needed for moderate pain. 12/02/13   [name], MD  Multiple Vitamin (MULTIVITAMIN) tablet Take 1 tablet by mouth daily.    Historical Provider, MD   History   Social History  . Marital Status: Married    Spouse Name: N/A    Number of Children: 3  . Years of Education: N/A   Occupational History  . PASTOR     [employer]   Social History Main Topics  . Smoking status: Never Smoker   . Smokeless tobacco: Not on file  . Alcohol Use: No  . Drug Use: No  . Sexual Activity: No   Other  Topics Concern  . Not on file   Social History Narrative   Marital status: married x 39 years.      Children: three children (35, 24, 22); three grandchildren.      Lives: with wife, middle daughter.  [facility] at [facility].        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      Exercise: no formal exercise program; walks to work (250 yards).      Seatbelts:  100%      Guns: none     Review of Systems  Constitutional: Negative for fever, chills, diaphoresis, activity change, appetite change and fatigue.  Eyes: Negative for visual disturbance.  Respiratory: Negative for cough and shortness of breath.   Cardiovascular: Negative for chest pain, palpitations and leg swelling.  Gastrointestinal: Positive for nausea. Negative for vomiting, abdominal pain,  diarrhea and constipation.  Endocrine: Negative for cold intolerance, heat intolerance, polydipsia, polyphagia and polyuria.  Genitourinary: Negative for dysuria, urgency, frequency, hematuria, decreased urine volume, scrotal swelling, difficulty urinating, genital sores and testicular pain.  Skin: Negative for wound.  Neurological: Positive for dizziness. Negative for tremors, seizures, syncope, facial asymmetry, speech difficulty, weakness, light-headedness, numbness and headaches.  Psychiatric/Behavioral: Negative for suicidal ideas, sleep disturbance, self-injury and dysphoric mood. The patient is not nervous/anxious.    Objective:   Physical Exam  Nursing note and vitals reviewed. Constitutional: He is oriented to person, place, and time. He appears well-developed and well-nourished. No distress.  HENT:  Head: Normocephalic and atraumatic.  Eyes: Conjunctivae and EOM are normal. Pupils are equal, round, and reactive to light.  Neck: Normal range of motion. Neck supple. No thyromegaly present.  Cardiovascular: Normal rate, regular rhythm and normal heart sounds.   Pulmonary/Chest: Effort normal and breath sounds normal. He has no wheezes. He has no rales.  Abdominal: Soft. Bowel sounds are normal. He exhibits no distension and no mass. There is no tenderness. There is no rebound and no guarding.  Musculoskeletal: Normal range of motion.  Lymphadenopathy:    He has no cervical adenopathy.  Neurological: He is alert and oriented to person, place, and time. No cranial nerve deficit. He exhibits normal muscle tone.  Skin: Skin is warm and dry. No rash noted. He is not diaphoretic.  Psychiatric: He has a normal mood and affect. His behavior is normal.   Assessment & Plan:  Pure hypercholesterolemia - Plan: Comprehensive metabolic panel, Lipid panel  Glucose intolerance (impaired glucose tolerance) - Plan: Comprehensive metabolic panel, Hemoglobin A1c  Anxiety associated with  depression  Prostatitis, unspecified prostatitis type - Plan: PSA, POCT urinalysis dipstick, POCT UA - Microscopic Only  Anxiety - Plan: clonazePAM (KLONOPIN) 0.5 MG tablet  1.  Hypercholesterolemia: uncontrolled; obtain labs.  Intolerant to statins in the past. 2.  Glucose Intolerance:  New.  Obtain labs; recommend weight loss, exercise, low-carbohydrate food choices. 3.  Anxiety with depression: stable; refill of Zoloft provided at 50mg  daily.  Refill of Clonazepam provided as well. 4. Prostatitis acute: Recurrent; obtain u/a and PSA today. Much improved from onset.  Meds ordered this encounter  Medications  . sertraline (ZOLOFT) 50 MG tablet    Sig: Take 1 tablet (50 mg total) by mouth at bedtime.    Dispense:  30 tablet    Refill:  5  . clonazePAM (KLONOPIN) 0.5 MG tablet    Sig: Take 1/2-1 as needed once daily for anxiety    Dispense:  30 tablet    Refill:  5  I personally performed the services described in this documentation, which was scribed in my presence.  The recorded information has been reviewed and is accurate.   Reginia Forts, M.D.  [facility] [address] [place], [place]  [address] [phone] phone [phone] fax

## 2014-03-07 LAB — LIPID PANEL
CHOLESTEROL: 254 mg/dL — AB (ref 0–200)
HDL: 50 mg/dL (ref >39)
LDL CALC: 182 mg/dL — AB (ref 0–99)
TRIGLYCERIDES: 110 mg/dL (ref <150)
Total CHOL/HDL Ratio: 5.1 Ratio
VLDL: 22 mg/dL (ref 0–40)

## 2014-03-07 LAB — COMPREHENSIVE METABOLIC PANEL
ALK PHOS: 55 U/L (ref 39–117)
ALT: 22 U/L (ref 0–53)
AST: 16 U/L (ref 0–37)
Albumin: 4.5 g/dL (ref 3.5–5.2)
BILIRUBIN TOTAL: 0.6 mg/dL (ref 0.2–1.2)
BUN: 16 mg/dL (ref 6–23)
CO2: 30 mEq/L (ref 19–32)
Calcium: 9.6 mg/dL (ref 8.4–10.5)
Chloride: 103 mEq/L (ref 96–112)
Creat: 0.92 mg/dL (ref 0.50–1.35)
GLUCOSE: 99 mg/dL (ref 70–99)
Potassium: 4.7 mEq/L (ref 3.5–5.3)
SODIUM: 140 meq/L (ref 135–145)
Total Protein: 7 g/dL (ref 6.0–8.3)

## 2014-03-07 LAB — PSA: PSA: 2.66 ng/mL (ref <=4.00)

## 2014-05-01 ENCOUNTER — Ambulatory Visit (INDEPENDENT_AMBULATORY_CARE_PROVIDER_SITE_OTHER): Payer: BC Managed Care – PPO | Admitting: Emergency Medicine

## 2014-05-01 VITALS — BP 115/77 | HR 69 | Temp 97.7°F | Resp 16 | Ht 71.0 in | Wt 182.0 lb

## 2014-05-01 DIAGNOSIS — R51 Headache: Secondary | ICD-10-CM

## 2014-05-01 NOTE — Patient Instructions (Addendum)
We will schedule your CT within the next 1-2 days and forward results to you thereafter. If headache returns, please report immediately to [facility]. Will forward your visit note to your PCP, Dr. Tamala Julian. Recommend considering Effexor as an alternative to Zoloft.  Migraine Headache A migraine headache is an intense, throbbing pain on one or both sides of your head. A migraine can last for 30 minutes to several hours. CAUSES  The exact cause of a migraine headache is not always known. However, a migraine may be caused when nerves in the brain become irritated and release chemicals that cause inflammation. This causes pain. Certain things may also trigger migraines, such as:  Alcohol.  Smoking.  Stress.  Menstruation.  Aged cheeses.  Foods or drinks that contain nitrates, glutamate, aspartame, or tyramine.  Lack of sleep.  Chocolate.  Caffeine.  Hunger.  Physical exertion.  Fatigue.  Medicines used to treat chest pain (nitroglycerine), birth control pills, estrogen, and some blood pressure medicines. SIGNS AND SYMPTOMS  Pain on one or both sides of your head.  Pulsating or throbbing pain.  Severe pain that prevents daily activities.  Pain that is aggravated by any physical activity.  Nausea, vomiting, or both.  Dizziness.  Pain with exposure to bright lights, loud noises, or activity.  General sensitivity to bright lights, loud noises, or smells. Before you get a migraine, you may get warning signs that a migraine is coming (aura). An aura may include:  Seeing flashing lights.  Seeing bright spots, halos, or zigzag lines.  Having tunnel vision or blurred vision.  Having feelings of numbness or tingling.  Having trouble talking.  Having muscle weakness. DIAGNOSIS  A migraine headache is often diagnosed based on:  Symptoms.  Physical exam.  A CT scan or MRI of your head. These imaging tests cannot diagnose migraines,  but they can help rule out other causes of headaches. TREATMENT Medicines may be given for pain and nausea. Medicines can also be given to help prevent recurrent migraines.  HOME CARE INSTRUCTIONS  Only take over-the-counter or prescription medicines for pain or discomfort as directed by your health care provider. The use of long-term narcotics is not recommended.  Lie down in a dark, quiet room when you have a migraine.  Keep a journal to find out what may trigger your migraine headaches. For example, write down:  What you eat and drink.  How much sleep you get.  Any change to your diet or medicines.  Limit alcohol consumption.  Quit smoking if you smoke.  Get 7-9 hours of sleep, or as recommended by your health care provider.  Limit stress.  Keep lights dim if bright lights bother you and make your migraines worse. SEEK IMMEDIATE MEDICAL CARE IF:   Your migraine becomes severe.  You have a fever.  You have a stiff neck.  You have vision loss.  You have muscular weakness or loss of muscle control.  You start losing your balance or have trouble walking.  You feel faint or pass out.  You have severe symptoms that are different from your first symptoms. MAKE SURE YOU:   Understand these instructions.  Will watch your condition.  Will get help right away if you are not doing well or get worse. Document Released: 07/19/2005 Document Revised: 12/03/2013 Document Reviewed: 03/26/2013 [facility] Patient Information 2015 [place], [place]. This information is not intended to replace advice given to you by your health care provider. Make sure you discuss any  questions you have with your health care provider.  

## 2014-05-01 NOTE — Progress Notes (Signed)
   Subjective:    Patient ID: Brian Houston, male    DOB: July 31, 1956, 58 y.o.   MRN: [id number]  HPI  Patient is a 58 y.o. male presenting for worst headache of his life x 1 last night. Started as a light headache at dinner without any known precipitating factors, progressively worsened to its worst pain at 21:00. Headache was preceded by sensation of smelling something burning and severe nausea without vomiting. Headache was severe, "all-over", worse with lying down, had to stay sitting up. Worse part of headache lasted ~45 minutes. Patient took 2 Tylenol which seemed to help and 1 decongestant without any significant difference. Associated symptoms included feeling sweaty and clammy, pain radiated to neck and shoulders, photophobia. He has never had a headache of this type. Denies any recent changes in diet, caffeine intake, exercise, hydration, stress, increased anxiety. Of note, patient generally has intermittent mild headaches with weather changes. He has also had increased difficulty sleeping, moreso than normal, in the last week and admits that his Zoloft is no longer working as well as it used to. Patient's mother has a history of migraines. Denies any other aggravating or relieving factors.   Review of Systems  Constitutional: Negative for fever, diaphoresis and appetite change.  HENT: Negative for congestion, ear pain, hearing loss, sinus pressure, tinnitus and trouble swallowing.   Eyes: Positive for photophobia. Negative for pain and visual disturbance.  Respiratory: Negative for cough, chest tightness and shortness of breath.   Cardiovascular: Negative for chest pain and palpitations.  Gastrointestinal: Positive for nausea (severe). Negative for vomiting and abdominal pain.  Genitourinary: Negative.   Musculoskeletal: Negative for gait problem.  Neurological: Positive for headaches. Negative for dizziness, syncope, speech difficulty, weakness, light-headedness and numbness.    Psychiatric/Behavioral: Positive for sleep disturbance (Increased difficulty sleeping). Negative for confusion and agitation.       Objective:   Physical Exam  Vitals reviewed. Constitutional: He is oriented to person, place, and time. He appears well-developed and well-nourished. No distress.  Eyes: Conjunctivae are normal. Pupils are equal, round, and reactive to light. No scleral icterus.  Neck: Normal range of motion. Neck supple. No thyromegaly present.  Cardiovascular: Normal rate, regular rhythm and normal heart sounds.  Exam reveals no gallop and no friction rub.   No murmur heard. Pulmonary/Chest: Effort normal and breath sounds normal. He has no wheezes. He has no rales.  Neurological: He is alert and oriented to person, place, and time. No cranial nerve deficit (CN III-XII intact. CN I-II not evaluated.). Coordination normal.  Skin: Skin is warm and dry. He is not diaphoretic.  Psychiatric: He has a normal mood and affect. His behavior is normal.   BP 115/77  Pulse 69  Temp(Src) 97.7 F (36.5 C) (Oral)  Resp 16  Ht 5\' 11"  (1.803 m)  Wt 182 lb (82.555 kg)  BMI 25.40 kg/m2  SpO2 98%     Assessment & Plan:   1. Headache(784.0) Suspected migraine, will obtain CT to rule out intracranial process Headache possibly related to increased difficulty sleeping and decreased efficacy of Zoloft. Will forward encounter to PCP, Dr. Tamala Julian. Recommend possible switch to Effexor. - CT Head Wo Contrast; Future

## 2014-05-01 NOTE — Progress Notes (Signed)
Pt has been scheduled for a CT scan at [place] at [place] 10/1. Called and left message for pt with instructions.

## 2014-05-02 ENCOUNTER — Ambulatory Visit
Admission: RE | Admit: 2014-05-02 | Discharge: 2014-05-02 | Disposition: A | Discharge status: Home / Self Care | Payer: BC Managed Care – PPO | Source: Ambulatory Visit | Attending: Urgent Care | Admitting: Urgent Care

## 2014-05-02 DIAGNOSIS — R51 Headache: Secondary | ICD-10-CM

## 2014-07-04 ENCOUNTER — Ambulatory Visit (INDEPENDENT_AMBULATORY_CARE_PROVIDER_SITE_OTHER): Payer: BC Managed Care – PPO | Admitting: Emergency Medicine

## 2014-07-04 VITALS — BP 106/66 | HR 65 | Temp 98.1°F | Resp 18 | Ht 71.0 in | Wt 185.6 lb

## 2014-07-04 DIAGNOSIS — L02212 Cutaneous abscess of back [any part, except buttock]: Secondary | ICD-10-CM

## 2014-07-04 MED ORDER — SULFAMETHOXAZOLE-TRIMETHOPRIM 800-160 MG PO TABS: 1.0000 | ORAL_TABLET | Freq: Two times a day (BID) | ORAL | Status: DC

## 2014-07-04 NOTE — Patient Instructions (Signed)
aAbscess An abscess is an infected area that contains a collection of pus and debris.It can occur in almost any part of the body. An abscess is also known as a furuncle or boil. CAUSES  An abscess occurs when tissue gets infected. This can occur from blockage of oil or sweat glands, infection of hair follicles, or a minor injury to the skin. As the body tries to fight the infection, pus collects in the area and creates pressure under the skin. This pressure causes pain. People with weakened immune systems have difficulty fighting infections and get certain abscesses more often.  SYMPTOMS Usually an abscess develops on the skin and becomes a painful mass that is red, warm, and tender. If the abscess forms under the skin, you may feel a moveable soft area under the skin. Some abscesses break open (rupture) on their own, but most will continue to get worse without care. The infection can spread deeper into the body and eventually into the bloodstream, causing you to feel ill.  DIAGNOSIS  Your caregiver will take your medical history and perform a physical exam. A sample of fluid may also be taken from the abscess to determine what is causing your infection. TREATMENT  Your caregiver may prescribe antibiotic medicines to fight the infection. However, taking antibiotics alone usually does not cure an abscess. Your caregiver may need to make a small cut (incision) in the abscess to drain the pus. In some cases, gauze is packed into the abscess to reduce pain and to continue draining the area. HOME CARE INSTRUCTIONS   Only take over-the-counter or prescription medicines for pain, discomfort, or fever as directed by your caregiver.  If you were prescribed antibiotics, take them as directed. Finish them even if you start to feel better.  If gauze is used, follow your caregiver's directions for changing the gauze.  To avoid spreading the infection:  Keep your draining abscess covered with a  bandage.  Wash your hands well.  Do not share personal care items, towels, or whirlpools with others.  Avoid skin contact with others.  Keep your skin and clothes clean around the abscess.  Keep all follow-up appointments as directed by your caregiver. SEEK MEDICAL CARE IF:   You have increased pain, swelling, redness, fluid drainage, or bleeding.  You have muscle aches, chills, or a general ill feeling.  You have a fever. MAKE SURE YOU:   Understand these instructions.  Will watch your condition.  Will get help right away if you are not doing well or get worse. Document Released: 04/28/2005 Document Revised: 01/18/2012 Document Reviewed: 10/01/2011 [facility] Patient Information 2015 [place], [place]. This information is not intended to replace advice given to you by your health care provider. Make sure you discuss any questions you have with your health care provider.

## 2014-07-04 NOTE — Progress Notes (Signed)
[facility] [address]  [place] [address] [phone]  Date:  07/04/2014   Name:  Brian Houston   DOB:  [birth year]   MRN:  [id number]  PCP:  Reginia Forts, MD    Chief Complaint: Cellulitis   History of Present Illness:  Brian Houston is a 58 y.o. very pleasant male patient who presents with the following:  Cyst on his left lower back just about beltline for past 2 weeks.  No fever or chills. No history of injury Denies other complaint or health concern today.   Patient Active Problem List   Diagnosis Date Noted  . Unspecified vitamin D deficiency 10/02/2012  . Pure hypercholesterolemia 10/02/2012  . Anxiety 09/01/2012  . Insomnia 09/01/2012  . Chest pain 09/01/2012  . Heel pain 09/01/2012  . Thoracic back pain 09/01/2012  . Abdominal pain, RUQ 09/01/2012    Past Medical History  Diagnosis Date  . Depression   . Anxiety   . Allergy   . GERD (gastroesophageal reflux disease)     Past Surgical History  Procedure Laterality Date  . Appendectomy    . Cholecystectomy    . Stress testing  09/16/2012    low risk for ischemia. Ganji.  Symptoms: chest pain, SOB.  . Polysomnography  08/02/1998    negative for OSA.  No treatment warranted.    History  Substance Use Topics  . Smoking status: Never Smoker   . Smokeless tobacco: Not on file  . Alcohol Use: No    Family History  Problem Relation Age of Onset  . Anxiety disorder Mother   . Hyperlipidemia Mother   . Stroke Father 50  . COPD Father   . Hypertension Father   . Diabetes Father   . Heart disease Father     heart valve replacement.  . Anxiety disorder Brother   . Mental illness Brother   . Diabetes Brother   . Anxiety disorder Daughter   . Anxiety disorder Maternal Grandmother   . Mental retardation Maternal Grandmother   . Diabetes Paternal Grandfather   . Mental illness Sister     Allergies  Allergen Reactions  . Remeron [Mirtazapine]   . Zoloft [Sertraline Hcl]  Nausea Only    PER PATIENT THE 100 MG AND 75 MG MAKE HEAD FEEL WEIRD, CAN ONLY TAKE 25 MG  . Omeprazole Rash  . Penicillins Rash    Medication list has been reviewed and updated.  Current Outpatient Prescriptions on File Prior to Visit  Medication Sig Dispense Refill  . clonazePAM (KLONOPIN) 0.5 MG tablet Take 1/2-1 as needed once daily for anxiety 30 tablet 5  . fish oil-omega-3 fatty acids 1000 MG capsule Take 2 g by mouth daily.    Marland Kitchen ibuprofen (ADVIL,MOTRIN) 200 MG tablet Take 400 mg by mouth every 6 (six) hours as needed. For pain    . sertraline (ZOLOFT) 50 MG tablet Take 25 mg by mouth at bedtime.     No current facility-administered medications on file prior to visit.    Review of Systems:  As per HPI, otherwise negative.    Physical Examination: Filed Vitals:   07/04/14 1443  BP: 106/66  Pulse: 65  Temp: 98.1 F (36.7 C)  Resp: 18   Filed Vitals:   07/04/14 1443  Height: 5\' 11"  (1.803 m)  Weight: 185 lb 9.6 oz (84.188 kg)   Body mass index is 25.9 kg/(m^2). Ideal Body Weight: Weight in (lb) to have BMI = 25: 178.9  GEN: WDWN, NAD, Non-toxic, Alert & Oriented x 3 HEENT: Atraumatic, Normocephalic.  Ears and Nose: No external deformity. EXTR: No clubbing/cyanosis/edema NEURO: Normal gait.  PSYCH: Normally interactive. Conversant. Not depressed or anxious appearing.  Calm demeanor.  Back:  2 cm abscess on back.  Assessment and Plan: Abscess back  I&D  Signed,  Ellison Carwin, MD

## 2014-07-07 ENCOUNTER — Ambulatory Visit (INDEPENDENT_AMBULATORY_CARE_PROVIDER_SITE_OTHER): Payer: BC Managed Care – PPO | Admitting: Physician Assistant

## 2014-07-07 VITALS — BP 110/64 | HR 61 | Temp 99.6°F | Resp 20 | Ht 71.0 in | Wt 189.0 lb

## 2014-07-07 DIAGNOSIS — L02212 Cutaneous abscess of back [any part, except buttock]: Secondary | ICD-10-CM

## 2014-07-07 NOTE — Progress Notes (Signed)
   Subjective:    Patient ID: Brian Houston, male    DOB: 08-22-1955, 58 y.o.   MRN: [id number]  HPI Patient presents for wound recheck following I & D of back abscess 3 days ago. Has been well in the interim and denies pain, fever, or spreading erythema. Has not changed dressing as wife was not comfortable doing so. Is taking antibiotic without side effects.   Review of Systems  Constitutional: Negative for fever and chills.  Skin: Positive for wound. Negative for color change, pallor and rash.       Objective:   Physical Exam  Constitutional: He appears well-developed and well-nourished. No distress.  Blood pressure 110/64, pulse 61, temperature 99.6 F (37.6 C), temperature source Oral, resp. rate 20, height 5\' 11"  (1.803 m), weight 189 lb (85.73 kg), SpO2 94 %.   HENT:  Head: Normocephalic and atraumatic.  Right Ear: External ear normal.  Left Ear: External ear normal.  Skin: Skin is warm and dry. No rash noted. He is not diaphoretic. There is erythema (minimal around wound). No pallor.  Depth larger than surface wound. No induration. No necrotic tissue. Tissue is pink. Minimal purulence with irrigation, none when trying to express.   Procedure Consent obtained. Irrigation with 5 cc 1% lido. Wound explored. No purulence expressed. Loosely packed with 1/4 plain. Clean dressing placed.     Assessment & Plan:  1. Abscess of back Wound healing well. RTC in 3 days for wound recheck.   Alveta Heimlich PA-C  [facility] 07/07/2014 2:35 PM

## 2014-07-07 NOTE — Progress Notes (Signed)
The patient was discussed with me and I agree with the diagnosis and treatment plan.  

## 2014-09-02 ENCOUNTER — Ambulatory Visit (INDEPENDENT_AMBULATORY_CARE_PROVIDER_SITE_OTHER): Payer: BLUE CROSS/BLUE SHIELD | Admitting: Urgent Care

## 2014-09-02 VITALS — BP 108/68 | HR 72 | Temp 98.2°F | Resp 18 | Ht 63.0 in | Wt 188.2 lb

## 2014-09-02 DIAGNOSIS — R0981 Nasal congestion: Secondary | ICD-10-CM

## 2014-09-02 DIAGNOSIS — R0982 Postnasal drip: Secondary | ICD-10-CM

## 2014-09-02 DIAGNOSIS — J329 Chronic sinusitis, unspecified: Secondary | ICD-10-CM

## 2014-09-02 DIAGNOSIS — H8113 Benign paroxysmal vertigo, bilateral: Secondary | ICD-10-CM

## 2014-09-02 MED ORDER — FLUTICASONE PROPIONATE 50 MCG/ACT NA SUSP: 2.0000 | Freq: Every day | NASAL | Status: DC

## 2014-09-02 NOTE — Progress Notes (Signed)
    MRN: [id number] DOB: 09/18/1955  Subjective:   Brian Houston is a 59 y.o. male presenting for 2 day history of new onset vertigo. Patient admits that he has had 4 weeks of worsening sinus symptoms. States that at the beginning of January, he had clear discharge from right ear, cleared up in 1 day. Then developed sinus congestion, ear fullness, mild left ear pain, frontal sinus headache, postnasal drainage. Now having intermittent vertigo in addition, lasts less than 1 minute, aggravated by standing from sitting, sudden head movements, worst at night when he lays down. Denies fevers, double vision, itchy or watery red eyes, sinus pain, throat pain, tickle in his throat, chest pain, heart racing, shob, n/v, abdominal pain, abnormal coordination, difficulty with balance, weakness. No smoking or alcohol use. Denies any other aggravating or relieving factors, no other questions or concerns.  Brian Houston has a current medication list which includes the following prescription(s): clonazepam, fish oil-omega-3 fatty acids, ibuprofen, and sertraline.  She is allergic to remeron; zoloft; omeprazole; and penicillins.  Brian Houston  has a past medical history of Depression; Anxiety; Allergy; and GERD (gastroesophageal reflux disease). Also  has past surgical history that includes Appendectomy; Cholecystectomy; Stress testing (09/16/2012); and Polysomnography (08/02/1998).  ROS As in subjective.  Objective:   Vitals: BP 108/68 mmHg  Pulse 72  Temp(Src) 98.2 F (36.8 C) (Oral)  Resp 18  Ht 5\' 3"  (1.6 m)  Wt 188 lb 3.2 oz (85.367 kg)  BMI 33.35 kg/m2  SpO2 96%  Physical Exam  Constitutional: He is oriented to person, place, and time and well-developed, well-nourished, and in no distress.  HENT:  TM's flat bilaterally, no effusions or erythema. Nasal turbinates mildly edematous with clear rhinorrhea. Postnasal drip present (cobblestone appearance), without oropharyngeal erythema, exudates or tonsillar edema.    Eyes: Conjunctivae are normal. Right eye exhibits no discharge. Left eye exhibits no discharge. No scleral icterus.  Cardiovascular: Normal rate, regular rhythm, normal heart sounds and intact distal pulses.  Exam reveals no gallop and no friction rub.   No murmur heard. Pulmonary/Chest: Effort normal and breath sounds normal. No respiratory distress. He has no wheezes. He has no rales. He exhibits no tenderness.  Abdominal: Bowel sounds are normal.  Neurological: He is alert and oriented to person, place, and time. Coordination normal.  Skin: Skin is warm and dry. No rash noted. No erythema.   Dix-Hallpike maneuver performed with nystagmus observed bilaterally. Epley maneuver performed afterwards with resolution of nystagmus when turned to left side. Nystagmus did not resolve to the right. Patient tolerated maneuvers well.  Assessment and Plan :   1. BPPV (benign paroxysmal positional vertigo), bilateral - Anticipatory guidance provided. Counseled patient on doing Epley home maneuvers. - If symptoms worsen or fail to improve in 2 weeks return to clinic for re-evaluation.  2. Sinus congestion 3. Post-nasal drainage - Advised Flonase, sudafed and adequate hydration. - If no improvement or worsening symptoms, return to clinic in 1-2 weeks.  Brian Eagles, PA-C [facility] [phone] 09/02/2014 2:06 PM

## 2014-09-02 NOTE — Patient Instructions (Addendum)
Epley Maneuver Self-Care WHAT IS THE EPLEY MANEUVER? The Epley maneuver is an exercise you can do to relieve symptoms of benign paroxysmal positional vertigo (BPPV). This condition is often just referred to as vertigo. BPPV is caused by the movement of tiny crystals (canaliths) inside your inner ear. The accumulation and movement of canaliths in your inner ear causes a sudden spinning sensation (vertigo) when you move your head to certain positions. Vertigo usually lasts about 30 seconds. BPPV usually occurs in just one ear. If you get vertigo when you lie on your left side, you probably have BPPV in your left ear. Your health care provider can tell you which ear is involved.  BPPV may be caused by a head injury. Many people older than 50 get BPPV for unknown reasons. If you have been diagnosed with BPPV, your health care provider may teach you how to do this maneuver. BPPV is not life threatening (benign) and usually goes away in time.  WHEN SHOULD I PERFORM THE EPLEY MANEUVER? You can do this maneuver at home whenever you have symptoms of vertigo. You may do the Epley maneuver up to 3 times a day until your symptoms of vertigo go away. HOW SHOULD I DO THE EPLEY MANEUVER? 1. Sit on the edge of a bed or table with your back straight. Your legs should be extended or hanging over the edge of the bed or table.  2. Turn your head halfway toward the affected ear.  3. Lie backward quickly with your head turned until you are lying flat on your back. You may want to position a pillow under your shoulders.  4. Hold this position for 30 seconds. You may experience an attack of vertigo. This is normal. Hold this position until the vertigo stops. 5. Then turn your head to the opposite direction until your unaffected ear is facing the floor.  6. Hold this position for 30 seconds. You may experience an attack of vertigo. This is normal. Hold this position until the vertigo stops. 7. Now turn your whole body to  the same side as your head. Hold for another 30 seconds.  8. You can then sit back up. ARE THERE RISKS TO THIS MANEUVER? In some cases, you may have other symptoms (such as changes in your vision, weakness, or numbness). If you have these symptoms, stop doing the maneuver and call your health care provider. Even if doing these maneuvers relieves your vertigo, you may still have dizziness. Dizziness is the sensation of light-headedness but without the sensation of movement. Even though the Epley maneuver may relieve your vertigo, it is possible that your symptoms will return within 5 years. WHAT SHOULD I DO AFTER THIS MANEUVER? After doing the Epley maneuver, you can return to your normal activities. Ask your doctor if there is anything you should do at home to prevent vertigo. This may include:  Sleeping with two or more pillows to keep your head elevated.  Not sleeping on the side of your affected ear.  Getting up slowly from bed.  Avoiding sudden movements during the day.  Avoiding extreme head movement, like looking up or bending over.  Wearing a cervical collar to prevent sudden head movements. WHAT SHOULD I DO IF MY SYMPTOMS GET WORSE? Call your health care provider if your vertigo gets worse. Call your provider right way if you have other symptoms, including:   Nausea.  Vomiting.  Headache.  Weakness.  Numbness.  Vision changes. Document Released: 07/24/2013 Document Reviewed: 07/24/2013 ExitCare   Patient Information 2015 [place]. This information is not intended to replace advice given to you by your health care provider. Make sure you discuss any questions you have with your health care provider.    Please also pick up phenylephrine for your sinus congestion and postnasal drainage. Drink plenty of water, at least 32 ounces daily to help thin mucous. If your symptoms do not resolve in 1-2 weeks, please return for re-evaluation.

## 2014-09-09 ENCOUNTER — Encounter: Payer: Self-pay | Admitting: Family Medicine

## 2014-09-09 ENCOUNTER — Ambulatory Visit (INDEPENDENT_AMBULATORY_CARE_PROVIDER_SITE_OTHER): Payer: BLUE CROSS/BLUE SHIELD | Admitting: Family Medicine

## 2014-09-09 VITALS — BP 114/74 | HR 67 | Temp 97.9°F | Resp 16 | Ht 71.75 in | Wt 188.0 lb

## 2014-09-09 DIAGNOSIS — E7800 Pure hypercholesterolemia, unspecified: Secondary | ICD-10-CM

## 2014-09-09 DIAGNOSIS — R002 Palpitations: Secondary | ICD-10-CM

## 2014-09-09 DIAGNOSIS — Z125 Encounter for screening for malignant neoplasm of prostate: Secondary | ICD-10-CM

## 2014-09-09 DIAGNOSIS — Z1329 Encounter for screening for other suspected endocrine disorder: Secondary | ICD-10-CM

## 2014-09-09 DIAGNOSIS — Z131 Encounter for screening for diabetes mellitus: Secondary | ICD-10-CM

## 2014-09-09 DIAGNOSIS — F419 Anxiety disorder, unspecified: Secondary | ICD-10-CM

## 2014-09-09 DIAGNOSIS — G4700 Insomnia, unspecified: Secondary | ICD-10-CM

## 2014-09-09 DIAGNOSIS — Z0000 Encounter for general adult medical examination without abnormal findings: Secondary | ICD-10-CM

## 2014-09-09 LAB — CBC WITH DIFFERENTIAL/PLATELET
Basophils Absolute: 0 10*3/uL (ref 0.0–0.1)
Basophils Relative: 0 % (ref 0–1)
EOS ABS: 0.2 10*3/uL (ref 0.0–0.7)
Eosinophils Relative: 3 % (ref 0–5)
HEMATOCRIT: 46.4 % (ref 39.0–52.0)
Hemoglobin: 16 g/dL (ref 13.0–17.0)
LYMPHS ABS: 2.8 10*3/uL (ref 0.7–4.0)
Lymphocytes Relative: 43 % (ref 12–46)
MCH: 32 pg (ref 26.0–34.0)
MCHC: 34.5 g/dL (ref 30.0–36.0)
MCV: 92.8 fL (ref 78.0–100.0)
MONO ABS: 0.5 10*3/uL (ref 0.1–1.0)
MPV: 9.7 fL (ref 8.6–12.4)
Monocytes Relative: 8 % (ref 3–12)
NEUTROS PCT: 46 % (ref 43–77)
Neutro Abs: 2.9 10*3/uL (ref 1.7–7.7)
PLATELETS: 279 10*3/uL (ref 150–400)
RBC: 5 MIL/uL (ref 4.22–5.81)
RDW: 14 % (ref 11.5–15.5)
WBC: 6.4 10*3/uL (ref 4.0–10.5)

## 2014-09-09 LAB — COMPREHENSIVE METABOLIC PANEL
ALT: 27 U/L (ref 0–53)
AST: 18 U/L (ref 0–37)
Albumin: 4.3 g/dL (ref 3.5–5.2)
Alkaline Phosphatase: 50 U/L (ref 39–117)
BUN: 18 mg/dL (ref 6–23)
CALCIUM: 9.1 mg/dL (ref 8.4–10.5)
CO2: 25 mEq/L (ref 19–32)
CREATININE: 0.93 mg/dL (ref 0.50–1.35)
Chloride: 104 mEq/L (ref 96–112)
Glucose, Bld: 106 mg/dL — ABNORMAL HIGH (ref 70–99)
Potassium: 4.2 mEq/L (ref 3.5–5.3)
SODIUM: 142 meq/L (ref 135–145)
Total Bilirubin: 0.4 mg/dL (ref 0.2–1.2)
Total Protein: 6.9 g/dL (ref 6.0–8.3)

## 2014-09-09 LAB — LIPID PANEL
CHOL/HDL RATIO: 4.2 ratio
CHOLESTEROL: 208 mg/dL — AB (ref 0–200)
HDL: 49 mg/dL (ref >39)
LDL Cholesterol: 141 mg/dL — ABNORMAL HIGH (ref 0–99)
TRIGLYCERIDES: 91 mg/dL (ref <150)
VLDL: 18 mg/dL (ref 0–40)

## 2014-09-09 LAB — PSA: PSA: 3.35 ng/mL (ref <=4.00)

## 2014-09-09 LAB — TSH: TSH: 2.062 u[IU]/mL (ref 0.350–4.500)

## 2014-09-09 LAB — HEMOGLOBIN A1C
HEMOGLOBIN A1C: 5.8 % — AB (ref <5.7)
Mean Plasma Glucose: 120 mg/dL — ABNORMAL HIGH (ref <117)

## 2014-09-09 MED ORDER — SERTRALINE HCL 50 MG PO TABS
50.0000 mg | ORAL_TABLET | Freq: Every day | ORAL | Status: DC
Start: 2014-09-09 — End: 2015-06-24

## 2014-09-09 MED ORDER — ZALEPLON 5 MG PO CAPS: 5.0000 mg | ORAL_CAPSULE | Freq: Every evening | ORAL | Status: DC | PRN

## 2014-09-09 NOTE — Patient Instructions (Signed)

## 2014-09-09 NOTE — Progress Notes (Signed)
Subjective:    Patient ID: Brian Houston, male    DOB: [birth year], 59 y.o.   MRN: [id number]  HPI This 59 y.o. male presents for Complete Physical Examination.  Last physical: 08-29-2013 Colonoscopy: 03-2007 TDAP:  09-24-2010 Influenza:  refuses Eye exam:  +glasses; last eye exam 3 months;  [facility].  Early cataracts.  No glaucoma. Dental exam:  One year ago.    BPPV: improved; intermittent; first episode.  No nausea.  Not sleeping well; got up in middle of night with spinning of room.  Laying still helped.  +palpitations while taking sudafed for nasal congestion; stopped sudafed yesterday.  Insomnia: not sleeping well; will fall asleep between 2-4 am.  Has tried Ambien, Trazodone. Has tried Melatonin but must take a low dose.  OTC medications cause RLS.  Hypercholesterolemia:  Cutting out sweets mostly; afraid cholesterol will still be elevated.  Anxiety:  Taking Klonopin .25mg  qhs on Saturday night.  Has tried one tablet Klonopin qhs but has a hand over.  Emotionally doing really well; compliance Sertraline 50mg  daily.  Has learned that caffeine is the enemy; cannot handle caffeine.  Mood goes down immediately.  Avoids caffeine; drinks a lot of decaf unsweet tea.  Coffee causes more of the issue.  Mother cannot handle caffeine either.    Takes Advil two per month for aches and pains.     Review of Systems  Constitutional: Negative.  Negative for fever, chills, diaphoresis, activity change, appetite change, fatigue and unexpected weight change.  HENT: Positive for congestion, ear discharge, ear pain, postnasal drip and tinnitus. Negative for dental problem, drooling, facial swelling, hearing loss, mouth sores, nosebleeds, rhinorrhea, sinus pressure, sneezing, sore throat, trouble swallowing and voice change.   Eyes: Negative.  Negative for photophobia, pain, discharge, redness, itching and visual disturbance.  Respiratory: Negative.  Negative for apnea, cough, choking, chest tightness,  shortness of breath, wheezing and stridor.   Cardiovascular: Positive for palpitations. Negative for chest pain and leg swelling.  Gastrointestinal: Negative.  Negative for nausea, vomiting, abdominal pain, diarrhea, constipation and blood in stool.  Endocrine: Negative.  Negative for cold intolerance, heat intolerance, polydipsia, polyphagia and polyuria.  Genitourinary: Negative.  Negative for dysuria, urgency, frequency, hematuria, flank pain, decreased urine volume, discharge, penile swelling, scrotal swelling, enuresis, difficulty urinating, genital sores, penile pain and testicular pain.  Musculoskeletal: Negative.  Negative for myalgias, back pain, joint swelling, arthralgias, gait problem, neck pain and neck stiffness.  Skin: Negative.  Negative for color change, pallor, rash and wound.  Allergic/Immunologic: Negative.  Negative for environmental allergies, food allergies and immunocompromised state.  Neurological: Negative.  Negative for dizziness, tremors, seizures, syncope, facial asymmetry, speech difficulty, weakness, light-headedness, numbness and headaches.  Hematological: Negative.  Negative for adenopathy. Does not bruise/bleed easily.  Psychiatric/Behavioral: Positive for sleep disturbance. Negative for suicidal ideas, hallucinations, behavioral problems, confusion, self-injury, dysphoric mood, decreased concentration and agitation. The patient is not nervous/anxious and is not hyperactive.    Past Medical History  Diagnosis Date  . Depression   . Anxiety   . Allergy   . GERD (gastroesophageal reflux disease)    Past Surgical History  Procedure Laterality Date  . Appendectomy    . Cholecystectomy    . Stress testing  09/16/2012    low risk for ischemia. Ganji.  Symptoms: chest pain, SOB.  . Polysomnography  08/02/1998    negative for OSA.  No treatment warranted.   Allergies  Allergen Reactions  . Remeron [Mirtazapine]   . Omeprazole Rash  .  Penicillins Rash   History    Social History  . Marital Status: Married    Spouse Name: N/A    Number of Children: 3  . Years of Education: N/A   Occupational History  . PASTOR     [employer]   Social History Main Topics  . Smoking status: Former Research scientist (life sciences)  . Smokeless tobacco: Not on file  . Alcohol Use: No  . Drug Use: No  . Sexual Activity: No   Other Topics Concern  . Not on file   Social History Narrative   Marital status: married x 39 years.      Children: three children (35, 24, 22); three grandchildren.      Lives: with wife, middle daughter.  [facility] at [facility].        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      Exercise: no formal exercise program; walks to work (250 yards).      Seatbelts:  100%      Guns: none   Family History  Problem Relation Age of Onset  . Anxiety disorder Mother   . Hyperlipidemia Mother   . Stroke Father 23  . COPD Father   . Hypertension Father   . Diabetes Father   . Heart disease Father     heart valve replacement.  . Anxiety disorder Brother   . Mental illness Brother   . Diabetes Brother   . Anxiety disorder Daughter   . Anxiety disorder Maternal Grandmother   . Mental retardation Maternal Grandmother   . Diabetes Paternal Grandfather   . Mental illness Sister         Objective:   Physical Exam  Constitutional: He is oriented to person, place, and time. He appears well-developed and well-nourished. No distress.  HENT:  Head: Normocephalic and atraumatic.  Right Ear: External ear normal.  Left Ear: External ear normal.  Nose: Nose normal.  Mouth/Throat: Oropharynx is clear and moist.  Eyes: Conjunctivae and EOM are normal. Pupils are equal, round, and reactive to light.  Neck: Normal range of motion. Neck supple. Carotid bruit is not present. No thyromegaly present.  Cardiovascular: Normal rate, regular rhythm, normal heart sounds and intact distal pulses.  Exam reveals no gallop and no friction rub.   No murmur  heard. Pulmonary/Chest: Effort normal and breath sounds normal. He has no wheezes. He has no rales.  Abdominal: Soft. Bowel sounds are normal. He exhibits no distension and no mass. There is no tenderness. There is no rebound and no guarding. Hernia confirmed negative in the right inguinal area and confirmed negative in the left inguinal area.  Genitourinary: Testes normal and penis normal. Rectal exam shows external hemorrhoid. Prostate is enlarged. Right testis shows no mass, no swelling and no tenderness. Left testis shows no mass, no swelling and no tenderness. Circumcised.  Musculoskeletal:       Right shoulder: Normal.       Left shoulder: Normal.       Cervical back: Normal.  Lymphadenopathy:    He has no cervical adenopathy.       Right: No inguinal adenopathy present.       Left: No inguinal adenopathy present.  Neurological: He is alert and oriented to person, place, and time. He has normal reflexes. No cranial nerve deficit. He exhibits normal muscle tone. Coordination normal.  Skin: Skin is warm and dry. No rash noted. He is not diaphoretic.  Scattered skin changes facial, torso, extremities.  Psychiatric: He has a normal mood and affect. His behavior is normal. Judgment and thought content normal.   EKG:  NSR; no ST changes; no arrhythmia.     Assessment & Plan:   1. Annual physical exam -Anticipatory guidance provided --- ASA 81mg  daily, exercise daily.  -Immunizations reviewed; pt refused flu vaccine. -Colonoscopy UTD. -DRE completed; obtain PSA.  2. Pure hypercholesterolemia -Uncontrolled; intolerant to three different statins in past; continue fish oil; obtain labs.   - CBC with Differential/Platelet - Comprehensive metabolic panel - Lipid panel  3. Screening for diabetes mellitus -obtain labs. - Comprehensive metabolic panel - Hemoglobin A1c - POCT urinalysis dipstick  4. Screening for prostate cancer -DRE completed; obtain labs. - PSA - POCT urinalysis  dipstick  5. Screening for thyroid disorder - CBC with Differential/Platelet - TSH  6. Palpitations -New; while taking Sudafed; stable; EKG; call office if palpitations continue; obtain labs. - EKG 12-Lead  7. Insomnia -Uncontrolled; rx for Sonata provided.  8.  Anxiety and depression -Controlled with Zoloft 50mg  daily; no changes to management; avoid caffeine; follow-up six months.  Meds ordered this encounter  Medications  . zaleplon (SONATA) 5 MG capsule    Sig: Take 1 capsule (5 mg total) by mouth at bedtime as needed for sleep.    Dispense:  30 capsule    Refill:  5    Norwood Levo, M.D. [facility] [address] [place], [place]  [address] [phone] phone [phone] fax

## 2014-09-10 ENCOUNTER — Encounter: Payer: Self-pay | Admitting: *Deleted

## 2015-01-14 ENCOUNTER — Ambulatory Visit (INDEPENDENT_AMBULATORY_CARE_PROVIDER_SITE_OTHER): Payer: BLUE CROSS/BLUE SHIELD | Admitting: Family Medicine

## 2015-01-14 VITALS — BP 110/70 | HR 62 | Temp 97.9°F | Resp 19 | Ht 71.0 in | Wt 195.4 lb

## 2015-01-14 DIAGNOSIS — S46911A Strain of unspecified muscle, fascia and tendon at shoulder and upper arm level, right arm, initial encounter: Secondary | ICD-10-CM

## 2015-01-14 DIAGNOSIS — R059 Cough, unspecified: Secondary | ICD-10-CM

## 2015-01-14 DIAGNOSIS — L729 Follicular cyst of the skin and subcutaneous tissue, unspecified: Secondary | ICD-10-CM

## 2015-01-14 DIAGNOSIS — R05 Cough: Secondary | ICD-10-CM | POA: Diagnosis not present

## 2015-01-14 MED ORDER — AZITHROMYCIN 250 MG PO TABS: ORAL_TABLET | ORAL | Status: DC

## 2015-01-14 MED ORDER — METHOCARBAMOL 500 MG PO TABS: 500.0000 mg | ORAL_TABLET | Freq: Three times a day (TID) | ORAL | Status: DC | PRN

## 2015-01-14 NOTE — Progress Notes (Signed)
[facility] [address] [place] [address] [phone]  Date:  01/14/2015   Name:  Brian Houston   DOB:  [birth year]   MRN:  [id number]  PCP:  Reginia Forts, MD    Chief Complaint: Mass   History of Present Illness:  Brian Houston is a 59 y.o. very pleasant male patient who presents with the following:  He notes a growth on his left foot, between great and second toe- present for 1-2 months.  Painful to touch only.  It does not seem to be changing in size.   He is not aware of any injury, but does ride a motorcycle and could have contributed to the problem by shifting gears.    He also notes an upper respiratory infection; he has noted a cough, may have cough attacks.  He is not bringing up much, may occasionally produce a metallic tasting mucus.   He has had the cough off an on for 3-4 months.  He may notice some wheezing on occasion.   He has not noted any fever.  He does have nasal drainage.    He also has right shoulder pain.  This seemed to start after a recent long motorcycle trip.  This has been present for about 2 weeks.   He is not aware of any particular movements that cause pain, but he does have some aching at night.  It is not getting worse, may be getting a bit better Patient Active Problem List   Diagnosis Date Noted  . Unspecified vitamin D deficiency 10/02/2012  . Pure hypercholesterolemia 10/02/2012  . Anxiety 09/01/2012  . Insomnia 09/01/2012  . Chest pain 09/01/2012  . Heel pain 09/01/2012  . Thoracic back pain 09/01/2012  . Abdominal pain, RUQ 09/01/2012    Past Medical History  Diagnosis Date  . Depression   . Anxiety   . Allergy   . GERD (gastroesophageal reflux disease)     Past Surgical History  Procedure Laterality Date  . Appendectomy    . Cholecystectomy    . Stress testing  09/16/2012    low risk for ischemia. Ganji.  Symptoms: chest pain, SOB.  . Polysomnography  08/02/1998    negative for OSA.  No treatment  warranted.  . Fracture surgery  11/30/2013    R wrist surgery fracture    History  Substance Use Topics  . Smoking status: Former Research scientist (life sciences)  . Smokeless tobacco: Not on file  . Alcohol Use: No    Family History  Problem Relation Age of Onset  . Anxiety disorder Mother   . Hyperlipidemia Mother   . Stroke Father 70  . COPD Father   . Hypertension Father   . Diabetes Father   . Heart disease Father     heart valve replacement.  . Anxiety disorder Brother   . Mental illness Brother   . Diabetes Brother   . Hypertension Brother   . Hyperlipidemia Brother   . Anxiety disorder Daughter   . Anxiety disorder Maternal Grandmother   . Mental retardation Maternal Grandmother   . Diabetes Paternal Grandfather   . Mental illness Sister     Anxiety    Allergies  Allergen Reactions  . Remeron [Mirtazapine]   . Omeprazole Rash  . Penicillins Rash    Medication list has been reviewed and updated.  Current Outpatient Prescriptions on File Prior to Visit  Medication Sig Dispense Refill  . clonazePAM (KLONOPIN) 0.5 MG tablet Take 1/2-1 as needed once  daily for anxiety 30 tablet 5  . fish oil-omega-3 fatty acids 1000 MG capsule Take 2 g by mouth daily.    . fluticasone (FLONASE) 50 MCG/ACT nasal spray Place 2 sprays into both nostrils daily. 16 g 1  . ibuprofen (ADVIL,MOTRIN) 200 MG tablet Take 400 mg by mouth every 6 (six) hours as needed. For pain    . sertraline (ZOLOFT) 50 MG tablet Take 1 tablet (50 mg total) by mouth daily. 90 tablet 1  . zaleplon (SONATA) 5 MG capsule Take 1 capsule (5 mg total) by mouth at bedtime as needed for sleep. 30 capsule 5   No current facility-administered medications on file prior to visit.    Review of Systems:  As per HPI- otherwise negative.   Physical Examination: Filed Vitals:   01/14/15 1231  BP: 110/70  Pulse: 62  Temp: 97.9 F (36.6 C)  Resp: 19   Filed Vitals:   01/14/15 1231  Height: 5\' 11"  (1.803 m)  Weight: 195 lb 6.4 oz  (88.633 kg)   Body mass index is 27.26 kg/(m^2). Ideal Body Weight: Weight in (lb) to have BMI = 25: 178.9  GEN: WDWN, NAD, Non-toxic, A & O x 3, mild overweight, looks well HEENT: Atraumatic, Normocephalic. Neck supple. No masses, No LAD.  Bilateral TM wnl, oropharynx normal.  PEERL,EOMI.   Ears and Nose: No external deformity. CV: RRR, No M/G/R. No JVD. No thrill. No extra heart sounds. PULM: CTA B, no wheezes, crackles, rhonchi. No retractions. No resp. distress. No accessory muscle use. EXTR: No c/c/e NEURO Normal gait.  PSYCH: Normally interactive. Conversant. Not depressed or anxious appearing.  Calm demeanor.  Right foot: he has a small nodule palpable on the dorsum of the foot between the 1st and 2nd MT.  It is tender to touch and mobile, seems most c/w a cyst  Right shoulder:  Mild tenderness over the right sided trapezius and right anterior RCT insertion.  Normal rom in all directions, no sign of impingement   Assessment and Plan: Cough - Plan: azithromycin (ZITHROMAX) 250 MG tablet  Cyst of subcutaneous tissue  Right shoulder strain, initial encounter - Plan: methocarbamol (ROBAXIN) 500 MG tablet  Cough for several months- treat with azithromycin, let me know if not better Likely cyst on foot.  He is ok with waiting a bit longer to see if this may go away- if not will refer to ortho Mild right shoulder strain.  Robaxin for pain and for spasm in right trapezius.    Signed Lamar Blinks, MD

## 2015-01-14 NOTE — Patient Instructions (Signed)
Use the azithromycin as directed for your cough- let me know if not better I think the cyst on your foot is benign; however if it does not clear up in a month or two I would suggest that we have ortho look at this for you You have a right shoulder strain- use the robaxin as needed for spasm However remember this will make you sleepy; do not combine with your klonopin or sonata Let me know if shoulder not better soon

## 2015-03-05 ENCOUNTER — Encounter: Payer: Self-pay | Admitting: Family Medicine

## 2015-03-05 ENCOUNTER — Telehealth: Payer: Self-pay | Admitting: *Deleted

## 2015-03-05 DIAGNOSIS — F419 Anxiety disorder, unspecified: Secondary | ICD-10-CM

## 2015-03-05 MED ORDER — CLONAZEPAM 0.5 MG PO TABS: ORAL_TABLET | ORAL | Status: DC

## 2015-03-05 NOTE — Telephone Encounter (Signed)
Please call in refill of Clonazepam as approved. 

## 2015-03-05 NOTE — Telephone Encounter (Signed)
LM at pharmacy for Klonopin prescription.

## 2015-04-05 ENCOUNTER — Observation Stay (HOSPITAL_COMMUNITY)
Admission: EM | Admit: 2015-04-05 | Discharge: 2015-04-07 | Disposition: A | Discharge status: Home / Self Care | Payer: BLUE CROSS/BLUE SHIELD | Attending: Internal Medicine | Admitting: Internal Medicine

## 2015-04-05 ENCOUNTER — Observation Stay (HOSPITAL_COMMUNITY): Payer: BLUE CROSS/BLUE SHIELD

## 2015-04-05 ENCOUNTER — Encounter (HOSPITAL_COMMUNITY): Payer: Self-pay | Admitting: Emergency Medicine

## 2015-04-05 ENCOUNTER — Emergency Department (HOSPITAL_COMMUNITY): Payer: BLUE CROSS/BLUE SHIELD

## 2015-04-05 DIAGNOSIS — Z823 Family history of stroke: Secondary | ICD-10-CM | POA: Insufficient documentation

## 2015-04-05 DIAGNOSIS — G459 Transient cerebral ischemic attack, unspecified: Principal | ICD-10-CM | POA: Diagnosis present

## 2015-04-05 DIAGNOSIS — E7800 Pure hypercholesterolemia, unspecified: Secondary | ICD-10-CM | POA: Diagnosis present

## 2015-04-05 DIAGNOSIS — F329 Major depressive disorder, single episode, unspecified: Secondary | ICD-10-CM | POA: Diagnosis not present

## 2015-04-05 DIAGNOSIS — E785 Hyperlipidemia, unspecified: Secondary | ICD-10-CM | POA: Insufficient documentation

## 2015-04-05 DIAGNOSIS — Z87891 Personal history of nicotine dependence: Secondary | ICD-10-CM | POA: Insufficient documentation

## 2015-04-05 DIAGNOSIS — Z880 Allergy status to penicillin: Secondary | ICD-10-CM | POA: Diagnosis not present

## 2015-04-05 DIAGNOSIS — R531 Weakness: Secondary | ICD-10-CM | POA: Insufficient documentation

## 2015-04-05 DIAGNOSIS — F419 Anxiety disorder, unspecified: Secondary | ICD-10-CM | POA: Diagnosis not present

## 2015-04-05 LAB — I-STAT CHEM 8, ED
BUN: 29 mg/dL — ABNORMAL HIGH (ref 6–20)
BUN: 30 mg/dL — ABNORMAL HIGH (ref 6–20)
Calcium, Ion: 1.2 mmol/L (ref 1.12–1.23)
Calcium, Ion: 1.1 mmol/L — ABNORMAL LOW (ref 1.12–1.23)
Chloride: 102 mmol/L (ref 101–111)
Chloride: 103 mmol/L (ref 101–111)
Creatinine, Ser: 1.1 mg/dL (ref 0.61–1.24)
Creatinine, Ser: 1.1 mg/dL (ref 0.61–1.24)
Glucose, Bld: 110 mg/dL — ABNORMAL HIGH (ref 65–99)
Glucose, Bld: 101 mg/dL — ABNORMAL HIGH (ref 65–99)
HEMATOCRIT: 47 % (ref 39.0–52.0)
HEMATOCRIT: 46 % (ref 39.0–52.0)
HEMOGLOBIN: 16 g/dL (ref 13.0–17.0)
HEMOGLOBIN: 15.6 g/dL (ref 13.0–17.0)
POTASSIUM: 4.7 mmol/L (ref 3.5–5.1)
Potassium: 4.3 mmol/L (ref 3.5–5.1)
SODIUM: 140 mmol/L (ref 135–145)
Sodium: 139 mmol/L (ref 135–145)
TCO2: 28 mmol/L (ref 0–100)
TCO2: 24 mmol/L (ref 0–100)

## 2015-04-05 LAB — URINALYSIS, ROUTINE W REFLEX MICROSCOPIC
Bilirubin Urine: NEGATIVE
GLUCOSE, UA: NEGATIVE mg/dL
Hgb urine dipstick: NEGATIVE
KETONES UR: NEGATIVE mg/dL
LEUKOCYTES UA: NEGATIVE
NITRITE: NEGATIVE
PH: 6.5 (ref 5.0–8.0)
Protein, ur: NEGATIVE mg/dL
SPECIFIC GRAVITY, URINE: 1.01 (ref 1.005–1.030)
Urobilinogen, UA: 0.2 mg/dL (ref 0.0–1.0)

## 2015-04-05 LAB — RAPID URINE DRUG SCREEN, HOSP PERFORMED
Amphetamines: NOT DETECTED
BENZODIAZEPINES: NOT DETECTED
Barbiturates: NOT DETECTED
COCAINE: NOT DETECTED
OPIATES: NOT DETECTED
Tetrahydrocannabinol: NOT DETECTED

## 2015-04-05 LAB — CBC
HEMATOCRIT: 45.3 % (ref 39.0–52.0)
Hemoglobin: 15.1 g/dL (ref 13.0–17.0)
MCH: 31.3 pg (ref 26.0–34.0)
MCHC: 33.3 g/dL (ref 30.0–36.0)
MCV: 94 fL (ref 78.0–100.0)
Platelets: 276 10*3/uL (ref 150–400)
RBC: 4.82 MIL/uL (ref 4.22–5.81)
RDW: 13.5 % (ref 11.5–15.5)
WBC: 8.7 10*3/uL (ref 4.0–10.5)

## 2015-04-05 LAB — COMPREHENSIVE METABOLIC PANEL
ALT: 31 U/L (ref 17–63)
ANION GAP: 7 (ref 5–15)
AST: 23 U/L (ref 15–41)
Albumin: 3.9 g/dL (ref 3.5–5.0)
Alkaline Phosphatase: 53 U/L (ref 38–126)
BILIRUBIN TOTAL: 0.5 mg/dL (ref 0.3–1.2)
BUN: 20 mg/dL (ref 6–20)
CHLORIDE: 104 mmol/L (ref 101–111)
CO2: 25 mmol/L (ref 22–32)
Calcium: 8.7 mg/dL — ABNORMAL LOW (ref 8.9–10.3)
Creatinine, Ser: 1.09 mg/dL (ref 0.61–1.24)
GFR calc Af Amer: >60 mL/min (ref >60)
Glucose, Bld: 104 mg/dL — ABNORMAL HIGH (ref 65–99)
POTASSIUM: 4.6 mmol/L (ref 3.5–5.1)
Sodium: 136 mmol/L (ref 135–145)
TOTAL PROTEIN: 6.8 g/dL (ref 6.5–8.1)

## 2015-04-05 LAB — DIFFERENTIAL
BASOS ABS: 0 10*3/uL (ref 0.0–0.1)
BASOS PCT: 0 % (ref 0–1)
EOS ABS: 0.2 10*3/uL (ref 0.0–0.7)
EOS PCT: 3 % (ref 0–5)
Lymphocytes Relative: 37 % (ref 12–46)
Lymphs Abs: 3.3 10*3/uL (ref 0.7–4.0)
MONOS PCT: 7 % (ref 3–12)
Monocytes Absolute: 0.6 10*3/uL (ref 0.1–1.0)
NEUTROS PCT: 53 % (ref 43–77)
Neutro Abs: 4.6 10*3/uL (ref 1.7–7.7)

## 2015-04-05 LAB — I-STAT TROPONIN, ED: TROPONIN I, POC: 0 ng/mL (ref 0.00–0.08)

## 2015-04-05 LAB — ETHANOL: Alcohol, Ethyl (B): <5 mg/dL (ref <5)

## 2015-04-05 LAB — PROTIME-INR
INR: 1.1 (ref 0.00–1.49)
Prothrombin Time: 14.4 seconds (ref 11.6–15.2)

## 2015-04-05 LAB — APTT: APTT: 33 s (ref 24–37)

## 2015-04-05 MED ORDER — SENNOSIDES-DOCUSATE SODIUM 8.6-50 MG PO TABS: 1.0000 | ORAL_TABLET | Freq: Every evening | ORAL | Status: DC | PRN

## 2015-04-05 MED ORDER — ASPIRIN 325 MG PO TABS
325.0000 mg | ORAL_TABLET | Freq: Every day | ORAL | Status: DC
  Administered 2015-04-06 – 2015-04-07 (×2): 325 mg via ORAL
  Filled 2015-04-05 (×2): qty 1

## 2015-04-05 MED ORDER — ENOXAPARIN SODIUM 40 MG/0.4ML ~~LOC~~ SOLN
40.0000 mg | Freq: Every day | SUBCUTANEOUS | Status: DC
  Filled 2015-04-05 (×2): qty 0.4

## 2015-04-05 MED ORDER — STROKE: EARLY STAGES OF RECOVERY BOOK
Freq: Once | Status: AC
  Administered 2015-04-05: 23:00:00

## 2015-04-05 NOTE — ED Notes (Addendum)
Patient transported to CT 

## 2015-04-05 NOTE — Progress Notes (Signed)
Patient arrived to 5M05 AAOx4, pleasant. Vitals taken, tele placed. Questions answered. Wife at bedside. Will continue to monitor. Samora Jernberg, Rande Brunt, RN

## 2015-04-05 NOTE — H&P (Signed)
History and Physical  Brian Houston HFW:[id number] DOB: 1955-12-06 DOA: 04/05/2015  PCP: Reginia Forts, MD   Chief Complaint: L.side weakness / resolved   History of Present Illness:  Patient is a 59 year old male with history of anxiety who came with cc of left side weakness that started around noon today and spread from left arm to left leg but was waxing and waning with intermittent improvement until complete resolution around 6 pm. He thought initially it's anxiety attack so he took clonazepam dose but did not take any other meds. He denied any facial droop or slurred speech. He did eat after that incident and had no dysphagia. Other symptoms included imbalance with vertigo sensation which has been occuring intermittently for a month and left foot "pins and needles" sensation.    Review of Systems:  CONSTITUTIONAL:  No night sweats.  No fatigue, malaise, lethargy.  No fever or chills. Eyes:  No visual changes.  No eye pain.  No eye discharge.   ENT:    No epistaxis.  No sinus pain.  No sore throat.  No ear pain.  No congestion. RESPIRATORY:  No cough.  No wheeze.  No hemoptysis.  No shortness of breath. CARDIOVASCULAR:  No chest pains.  No palpitations. GASTROINTESTINAL:  No abdominal pain.  No nausea or vomiting.  No diarrhea or constipation.  No hematemesis.  No hematochezia.  No melena. GENITOURINARY:  No urgency.  No frequency.  No dysuria.  No hematuria.  No obstructive symptoms.  No discharge.  No pain.  No significant abnormal bleeding. MUSCULOSKELETAL:  No musculoskeletal pain.  No joint swelling.  No arthritis. NEUROLOGICAL:  No confusion.  weakness. No headache. No seizure. PSYCHIATRIC:  No depression. anxiety. No suicidal ideation. SKIN:  No rashes.  No lesions.  No wounds. ENDOCRINE:  No unexplained weight loss.  No polydipsia.  No polyuria.  No polyphagia. HEMATOLOGIC:  No anemia.  No purpura.  No petechiae.  No bleeding.  ALLERGIC AND IMMUNOLOGIC:  No pruritus.  No  swelling Other:  Past Medical and Surgical History:   Past Medical History  Diagnosis Date  . Depression   . Anxiety   . Allergy   . GERD (gastroesophageal reflux disease)    Past Surgical History  Procedure Laterality Date  . Appendectomy    . Cholecystectomy    . Stress testing  09/16/2012    low risk for ischemia. Ganji.  Symptoms: chest pain, SOB.  . Polysomnography  08/02/1998    negative for OSA.  No treatment warranted.  . Fracture surgery  11/30/2013    R wrist surgery fracture    Social History:   reports that he has quit smoking. He does not have any smokeless tobacco history on file. He reports that he does not drink alcohol or use illicit drugs. He smoked for 10 years about 1/2 PPD but quit 30 years ago.   Allergies  Allergen Reactions  . Remeron [Mirtazapine]   . Omeprazole Rash  . Penicillins Rash    Family History  Problem Relation Age of Onset  . Anxiety disorder Mother   . Hyperlipidemia Mother   . Stroke Father 62  . COPD Father   . Hypertension Father   . Diabetes Father   . Heart disease Father     heart valve replacement.  . Anxiety disorder Brother   . Mental illness Brother   . Diabetes Brother   . Hypertension Brother   . Hyperlipidemia Brother   . Anxiety disorder Daughter   .  Anxiety disorder Maternal Grandmother   . Mental retardation Maternal Grandmother   . Diabetes Paternal Grandfather   . Mental illness Sister     Anxiety      Prior to Admission medications   Medication Sig Start Date End Date Taking? Authorizing Provider  azithromycin (ZITHROMAX) 250 MG tablet Use as a zpack 01/14/15   Darreld Mclean, MD  clonazePAM (KLONOPIN) 0.5 MG tablet Take 1/2-1 as needed once daily for anxiety 03/05/15   [name], MD  fish oil-omega-3 fatty acids 1000 MG capsule Take 2 g by mouth daily.    Historical Provider, MD  fluticasone (FLONASE) 50 MCG/ACT nasal spray Place 2 sprays into both nostrils daily. 09/02/14   Jaynee Eagles, PA-C    ibuprofen (ADVIL,MOTRIN) 200 MG tablet Take 400 mg by mouth every 6 (six) hours as needed. For pain    Historical Provider, MD  methocarbamol (ROBAXIN) 500 MG tablet Take 1 tablet (500 mg total) by mouth every 8 (eight) hours as needed. 01/14/15   Gay Filler Copland, MD  sertraline (ZOLOFT) 50 MG tablet Take 1 tablet (50 mg total) by mouth daily. 09/09/14   [name], MD  zaleplon (SONATA) 5 MG capsule Take 1 capsule (5 mg total) by mouth at bedtime as needed for sleep. 09/09/14   [name], MD    Physical Exam: BP 125/70 mmHg  Pulse 56  Temp(Src) 97.7 F (36.5 C) (Oral)  Resp 21  Ht 5\' 11"  (1.803 m)  Wt 83.915 kg (185 lb)  BMI 25.81 kg/m2  SpO2 100%  GENERAL : Well developed, well nourished, alert and cooperative, and appears to be in no acute distress. HEAD: normocephalic. EYES: PERRL, EOMI. Fundi normal, vision is grossly intact. EARS: External auditory canals and tympanic membranes clear, hearing grossly intact. NOSE: No nasal discharge. THROAT: Oral cavity and pharynx normal. No inflammation, swelling, exudate, or lesions.  NECK: Neck supple, non-tender without lymphadenopathy, masses or thyromegaly. CARDIAC: Normal S1 and S2. No S3, S4 or murmurs. Rhythm is regular. There is no peripheral edema, cyanosis or pallor. Extremities are warm and well perfused. No carotid bruits. LUNGS: Clear to auscultation and percussion without rales, rhonchi, wheezing or diminished breath sounds. ABDOMEN: Positive bowel sounds. Soft, nondistended, nontender. No guarding or rebound. No masses. MUSKULOSKELETAL: Adequately aligned spine. ROM intact spine and extremities. No joint erythema or tenderness. Normal muscular development. Normal gait. EXTREMITIES: No significant deformity or joint abnormality. No edema. Peripheral pulses intact. No varicosities. NEUROLOGICAL: The mental examination revealed the patient was oriented to person, place, and time.CN II-XII intact. Strength and sensation  symmetric and intact throughout. Reflexes 2+ throughout. Cerebellar testing normal. SKIN: Skin normal color, texture and turgor with no lesions or eruptions. PSYCHIATRIC:  The patient was able to demonstrate good judgement and reason, without hallucinations, abnormal affect or abnormal behaviors during the examination. Patient is not suicidal.          Labs on Admission:  Reviewed.   Radiological Exams on Admission: Ct Head Wo Contrast  04/05/2015   CLINICAL DATA:  Code stroke. Intermittent lightheadedness, heaviness and unsteady gait. Initial encounter.  EXAM: CT HEAD WITHOUT CONTRAST  CT CERVICAL SPINE WITHOUT CONTRAST  TECHNIQUE: Multidetector CT imaging of the head and cervical spine was performed following the standard protocol without intravenous contrast. Multiplanar CT image reconstructions of the cervical spine were also generated.  COMPARISON:  CT of the head performed 05/02/2014, and MRI/MRA of the brain performed 07/14/2006  FINDINGS: CT HEAD FINDINGS  There  is no evidence of acute infarction, mass lesion, or intra- or extra-axial hemorrhage on CT.  Scattered periventricular white matter change likely reflects small vessel ischemic microangiopathy.  The posterior fossa, including the cerebellum, brainstem and fourth ventricle, is within normal limits. The third and lateral ventricles, and basal ganglia are unremarkable in appearance. The cerebral hemispheres are symmetric in appearance, with normal gray-white differentiation. No mass effect or midline shift is seen.  There is no evidence of fracture; visualized osseous structures are unremarkable in appearance. The orbits are within normal limits. The paranasal sinuses and mastoid air cells are well-aerated. No significant soft tissue abnormalities are seen.  CT CERVICAL SPINE FINDINGS  There is no evidence of fracture or subluxation. Vertebral bodies demonstrate normal height and alignment. There is mild disc space narrowing at C5-C6, with  small anterior and posterior disc osteophyte complexes. Prevertebral soft tissues are within normal limits. The visualized neural foramina are grossly unremarkable.  The thyroid gland is unremarkable in appearance. The visualized lung apices are clear. No significant soft tissue abnormalities are seen.  IMPRESSION: 1. No evidence of traumatic intracranial injury or fracture. 2. No evidence of fracture or subluxation along the cervical spine. 3. Minimal degenerative change at C5-C6. 4. Scattered small vessel ischemic microangiopathy. These results were called by telephone at the time of interpretation on 04/05/2015 at 8:49 pm to Dr. Nicole Kindred, who verbally acknowledged these results.   Electronically Signed   By: Garald Balding M.D.   On: 04/05/2015 20:58   Ct Cervical Spine Wo Contrast  04/05/2015   CLINICAL DATA:  Code stroke. Intermittent lightheadedness, heaviness and unsteady gait. Initial encounter.  EXAM: CT HEAD WITHOUT CONTRAST  CT CERVICAL SPINE WITHOUT CONTRAST  TECHNIQUE: Multidetector CT imaging of the head and cervical spine was performed following the standard protocol without intravenous contrast. Multiplanar CT image reconstructions of the cervical spine were also generated.  COMPARISON:  CT of the head performed 05/02/2014, and MRI/MRA of the brain performed 07/14/2006  FINDINGS: CT HEAD FINDINGS  There is no evidence of acute infarction, mass lesion, or intra- or extra-axial hemorrhage on CT.  Scattered periventricular white matter change likely reflects small vessel ischemic microangiopathy.  The posterior fossa, including the cerebellum, brainstem and fourth ventricle, is within normal limits. The third and lateral ventricles, and basal ganglia are unremarkable in appearance. The cerebral hemispheres are symmetric in appearance, with normal gray-white differentiation. No mass effect or midline shift is seen.  There is no evidence of fracture; visualized osseous structures are unremarkable in  appearance. The orbits are within normal limits. The paranasal sinuses and mastoid air cells are well-aerated. No significant soft tissue abnormalities are seen.  CT CERVICAL SPINE FINDINGS  There is no evidence of fracture or subluxation. Vertebral bodies demonstrate normal height and alignment. There is mild disc space narrowing at C5-C6, with small anterior and posterior disc osteophyte complexes. Prevertebral soft tissues are within normal limits. The visualized neural foramina are grossly unremarkable.  The thyroid gland is unremarkable in appearance. The visualized lung apices are clear. No significant soft tissue abnormalities are seen.  IMPRESSION: 1. No evidence of traumatic intracranial injury or fracture. 2. No evidence of fracture or subluxation along the cervical spine. 3. Minimal degenerative change at C5-C6. 4. Scattered small vessel ischemic microangiopathy. These results were called by telephone at the time of interpretation on 04/05/2015 at 8:49 pm to Dr. Nicole Kindred, who verbally acknowledged these results.   Electronically Signed   By: Garald Balding M.D.   On:  04/05/2015 20:58    EKG:  Independently reviewed. Sinus rhythm  Assessment/Plan  Transient ischemic attack:  Sx: left side weakness: resolved CT head with no acute changes.  Neuro consulted: appreciate recs.  Order : Echo, Carotid dupleux, MRI/MRA brain/neck, Lipid profile, HbA1c  Started on daily aspirin PT/OT ordered Passed swallow test at bedside : diet cardiac  Anxiety: continue Klonipin as needed.    DVT prophylaxis: enoxaparin  Consultants: Neuro Code Status: Full  Family Communication: wife at bedside  Disposition Plan: admit to tele     Gennaro Africa M.D [facility]

## 2015-04-05 NOTE — ED Provider Notes (Signed)
CSN: [id number]     Arrival date & time 04/05/15  1745 History   First MD Initiated Contact with Patient 04/05/15 2002     Chief Complaint  Patient presents with  . Weakness     (Consider location/radiation/quality/duration/timing/severity/associated sxs/prior Treatment) HPI Comments: This is a 59 year old male who presents with left arm and left leg heaviness, intermittent episodes of lightheadedness, not affected by head movement, intermittent nausea and per his wife and gait that is not right.  This all started approximately noon today.  Patient initially thought he was having an anxiety attack, took a clonazepam at a nap, but when he awoke, had the same symptoms.  He does have a history of diet-controlled hyperlipidemia, depression and anxiety.  He does take Zoloft on a regular basis and clonazepam as needed.  Patient is a 59 y.o. male presenting with weakness. The history is provided by the patient.  Weakness This is a new problem. The current episode started today. The problem occurs constantly. The problem has been unchanged. Associated symptoms include nausea and weakness. Pertinent negatives include no arthralgias, chest pain, chills, coughing, fever, headaches, joint swelling, neck pain, numbness or rash. Nothing aggravates the symptoms. He has tried nothing for the symptoms. The treatment provided no relief.    Past Medical History  Diagnosis Date  . Depression   . Anxiety   . Allergy   . GERD (gastroesophageal reflux disease)    Past Surgical History  Procedure Laterality Date  . Appendectomy    . Cholecystectomy    . Stress testing  09/16/2012    low risk for ischemia. [place].  Symptoms: chest pain, SOB.  . Polysomnography  08/02/1998    negative for OSA.  No treatment warranted.  . Fracture surgery  11/30/2013    R wrist surgery fracture   Family History  Problem Relation Age of Onset  . Anxiety disorder Mother   . Hyperlipidemia Mother   . Stroke Father 48  . COPD  Father   . Hypertension Father   . Diabetes Father   . Heart disease Father     heart valve replacement.  . Anxiety disorder Brother   . Mental illness Brother   . Diabetes Brother   . Hypertension Brother   . Hyperlipidemia Brother   . Anxiety disorder Daughter   . Anxiety disorder Maternal Grandmother   . Mental retardation Maternal Grandmother   . Diabetes Paternal Grandfather   . Mental illness Sister     Anxiety   Social History  Substance Use Topics  . Smoking status: Former Research scientist (life sciences)  . Smokeless tobacco: None  . Alcohol Use: No    Review of Systems  Constitutional: Negative for fever and chills.  Eyes: Negative for visual disturbance.  Respiratory: Negative for cough.   Cardiovascular: Negative for chest pain.  Gastrointestinal: Positive for nausea.  Musculoskeletal: Negative for joint swelling, arthralgias and neck pain.  Skin: Negative for rash and wound.  Neurological: Positive for weakness and light-headedness. Negative for numbness and headaches.  All other systems reviewed and are negative.     Allergies  Remeron; Omeprazole; and Penicillins  Home Medications   Prior to Admission medications   Medication Sig Start Date End Date Taking? Authorizing Provider  azithromycin (ZITHROMAX) 250 MG tablet Use as a zpack 01/14/15   Darreld Mclean, MD  clonazePAM (KLONOPIN) 0.5 MG tablet Take 1/2-1 as needed once daily for anxiety 03/05/15   [name], MD  fish oil-omega-3 fatty acids 1000 MG capsule  Take 2 g by mouth daily.    Historical Provider, MD  fluticasone (FLONASE) 50 MCG/ACT nasal spray Place 2 sprays into both nostrils daily. 09/02/14   Jaynee Eagles, PA-C  ibuprofen (ADVIL,MOTRIN) 200 MG tablet Take 400 mg by mouth every 6 (six) hours as needed. For pain    Historical Provider, MD  methocarbamol (ROBAXIN) 500 MG tablet Take 1 tablet (500 mg total) by mouth every 8 (eight) hours as needed. 01/14/15   Gay Filler Copland, MD  sertraline (ZOLOFT) 50 MG tablet  Take 1 tablet (50 mg total) by mouth daily. 09/09/14   [name], MD  zaleplon (SONATA) 5 MG capsule Take 1 capsule (5 mg total) by mouth at bedtime as needed for sleep. 09/09/14   [name], MD   BP 125/70 mmHg  Pulse 56  Temp(Src) 97.7 F (36.5 C) (Oral)  Resp 21  Ht 5\' 11"  (1.803 m)  Wt 185 lb (83.915 kg)  BMI 25.81 kg/m2  SpO2 100% Physical Exam  Constitutional: He is oriented to person, place, and time. He appears well-developed and well-nourished.  HENT:  Head: Normocephalic.  Right Ear: External ear normal.  Left Ear: External ear normal.  Eyes: Pupils are equal, round, and reactive to light.  Neck: Normal range of motion.  Cardiovascular: Normal rate and regular rhythm.   Pulmonary/Chest: Effort normal and breath sounds normal.  Abdominal: Soft. He exhibits no distension. There is no tenderness.  Musculoskeletal: Normal range of motion. He exhibits no edema or tenderness.  Neurological: He is alert and oriented to person, place, and time. A cranial nerve deficit is present.  Skin: Skin is warm. No rash noted. No pallor.  Vitals reviewed.   ED Course  Procedures (including critical care time) Labs Review Labs Reviewed  COMPREHENSIVE METABOLIC PANEL - Abnormal; Notable for the following:    Glucose, Bld 104 (*)    Calcium 8.7 (*)    All other components within normal limits  I-STAT CHEM 8, ED - Abnormal; Notable for the following:    BUN 29 (*)    Glucose, Bld 110 (*)    All other components within normal limits  I-STAT CHEM 8, ED - Abnormal; Notable for the following:    BUN 30 (*)    Glucose, Bld 101 (*)    Calcium, Ion 1.10 (*)    All other components within normal limits  ETHANOL  PROTIME-INR  APTT  CBC  DIFFERENTIAL  URINE RAPID DRUG SCREEN, HOSP PERFORMED  URINALYSIS, ROUTINE W REFLEX MICROSCOPIC (NOT AT [facility])  CBG MONITORING, ED  I-STAT TROPOININ, ED    Imaging Review Ct Head Wo Contrast  04/05/2015   CLINICAL DATA:  Code stroke.  Intermittent lightheadedness, heaviness and unsteady gait. Initial encounter.  EXAM: CT HEAD WITHOUT CONTRAST  CT CERVICAL SPINE WITHOUT CONTRAST  TECHNIQUE: Multidetector CT imaging of the head and cervical spine was performed following the standard protocol without intravenous contrast. Multiplanar CT image reconstructions of the cervical spine were also generated.  COMPARISON:  CT of the head performed 05/02/2014, and MRI/MRA of the brain performed 07/14/2006  FINDINGS: CT HEAD FINDINGS  There is no evidence of acute infarction, mass lesion, or intra- or extra-axial hemorrhage on CT.  Scattered periventricular white matter change likely reflects small vessel ischemic microangiopathy.  The posterior fossa, including the cerebellum, brainstem and fourth ventricle, is within normal limits. The third and lateral ventricles, and basal ganglia are unremarkable in appearance. The cerebral hemispheres are symmetric in appearance, with normal  gray-white differentiation. No mass effect or midline shift is seen.  There is no evidence of fracture; visualized osseous structures are unremarkable in appearance. The orbits are within normal limits. The paranasal sinuses and mastoid air cells are well-aerated. No significant soft tissue abnormalities are seen.  CT CERVICAL SPINE FINDINGS  There is no evidence of fracture or subluxation. Vertebral bodies demonstrate normal height and alignment. There is mild disc space narrowing at C5-C6, with small anterior and posterior disc osteophyte complexes. Prevertebral soft tissues are within normal limits. The visualized neural foramina are grossly unremarkable.  The thyroid gland is unremarkable in appearance. The visualized lung apices are clear. No significant soft tissue abnormalities are seen.  IMPRESSION: 1. No evidence of traumatic intracranial injury or fracture. 2. No evidence of fracture or subluxation along the cervical spine. 3. Minimal degenerative change at C5-C6. 4.  Scattered small vessel ischemic microangiopathy. These results were called by telephone at the time of interpretation on 04/05/2015 at 8:49 pm to Dr. Nicole Kindred, who verbally acknowledged these results.   Electronically Signed   By: Garald Balding M.D.   On: 04/05/2015 20:58   Ct Cervical Spine Wo Contrast  04/05/2015   CLINICAL DATA:  Code stroke. Intermittent lightheadedness, heaviness and unsteady gait. Initial encounter.  EXAM: CT HEAD WITHOUT CONTRAST  CT CERVICAL SPINE WITHOUT CONTRAST  TECHNIQUE: Multidetector CT imaging of the head and cervical spine was performed following the standard protocol without intravenous contrast. Multiplanar CT image reconstructions of the cervical spine were also generated.  COMPARISON:  CT of the head performed 05/02/2014, and MRI/MRA of the brain performed 07/14/2006  FINDINGS: CT HEAD FINDINGS  There is no evidence of acute infarction, mass lesion, or intra- or extra-axial hemorrhage on CT.  Scattered periventricular white matter change likely reflects small vessel ischemic microangiopathy.  The posterior fossa, including the cerebellum, brainstem and fourth ventricle, is within normal limits. The third and lateral ventricles, and basal ganglia are unremarkable in appearance. The cerebral hemispheres are symmetric in appearance, with normal gray-white differentiation. No mass effect or midline shift is seen.  There is no evidence of fracture; visualized osseous structures are unremarkable in appearance. The orbits are within normal limits. The paranasal sinuses and mastoid air cells are well-aerated. No significant soft tissue abnormalities are seen.  CT CERVICAL SPINE FINDINGS  There is no evidence of fracture or subluxation. Vertebral bodies demonstrate normal height and alignment. There is mild disc space narrowing at C5-C6, with small anterior and posterior disc osteophyte complexes. Prevertebral soft tissues are within normal limits. The visualized neural foramina are  grossly unremarkable.  The thyroid gland is unremarkable in appearance. The visualized lung apices are clear. No significant soft tissue abnormalities are seen.  IMPRESSION: 1. No evidence of traumatic intracranial injury or fracture. 2. No evidence of fracture or subluxation along the cervical spine. 3. Minimal degenerative change at C5-C6. 4. Scattered small vessel ischemic microangiopathy. These results were called by telephone at the time of interpretation on 04/05/2015 at 8:49 pm to Dr. Nicole Kindred, who verbally acknowledged these results.   Electronically Signed   By: Garald Balding M.D.   On: 04/05/2015 20:58   I have personally reviewed and evaluated these images and lab results as part of my medical decision-making.   EKG Interpretation None     Patient has been evaluated by Dr. Rochele Pages on hospital, neurologist.  He meets criteria for admission for TIA evaluation.  He will be admitted through the hospitalist system MDM   Final diagnoses:  Transient cerebral ischemia, unspecified transient cerebral ischemia type         Junius Creamer, NP 04/05/15 2125  Milton Ferguson, MD 04/05/15 2330

## 2015-04-05 NOTE — Consult Note (Signed)
Admission H&P    Chief Complaint: New onset feeling of heaviness involving his left side as well as associated lightheadedness.  HPI: Brian Houston is an 59 y.o. male history of anxiety and depression as well as GERD, presenting with new onset headedness and actually waiting feeling of heaviness involving left extremities. He is also complaining of change in gait associated with heaviness of his left leg. Symptom onset was at noon today. Has no previous history of stroke nor TIA. He has not been on antiplatelets therapy. CT scan of his head showed no acute intracranial abnormality. His NIH stroke score was 0 at the time of this evaluation.  LSN: 12:00 melanoma on 04/05/2015 tPA Given: No: No demonstrable clinical deficit mRankin:  Past Medical History  Diagnosis Date  . Depression   . Anxiety   . Allergy   . GERD (gastroesophageal reflux disease)     Past Surgical History  Procedure Laterality Date  . Appendectomy    . Cholecystectomy    . Stress testing  09/16/2012    low risk for ischemia. [place].  Symptoms: chest pain, SOB.  . Polysomnography  08/02/1998    negative for OSA.  No treatment warranted.  . Fracture surgery  11/30/2013    R wrist surgery fracture    Family History  Problem Relation Age of Onset  . Anxiety disorder Mother   . Hyperlipidemia Mother   . Stroke Father 29  . COPD Father   . Hypertension Father   . Diabetes Father   . Heart disease Father     heart valve replacement.  . Anxiety disorder Brother   . Mental illness Brother   . Diabetes Brother   . Hypertension Brother   . Hyperlipidemia Brother   . Anxiety disorder Daughter   . Anxiety disorder Maternal Grandmother   . Mental retardation Maternal Grandmother   . Diabetes Paternal Grandfather   . Mental illness Sister     Anxiety   Social History:  reports that he has quit smoking. He does not have any smokeless tobacco history on file. He reports that he does not drink alcohol or use illicit  drugs.  Allergies:  Allergies  Allergen Reactions  . Remeron [Mirtazapine]   . Omeprazole Rash  . Penicillins Rash    Medications: Patient's preadmission medications were reviewed by me.  ROS: History obtained from the patient  General ROS: negative for - chills, fatigue, fever, night sweats, weight gain or weight loss Psychological ROS: negative for - behavioral disorder, hallucinations, memory difficulties, mood swings or suicidal ideation Ophthalmic ROS: negative for - blurry vision, double vision, eye pain or loss of vision ENT ROS: negative for - epistaxis, nasal discharge, oral lesions, sore throat, tinnitus or vertigo Allergy and Immunology ROS: negative for - hives or itchy/watery eyes Hematological and Lymphatic ROS: negative for - bleeding problems, bruising or swollen lymph nodes Endocrine ROS: negative for - galactorrhea, hair pattern changes, polydipsia/polyuria or temperature intolerance Respiratory ROS: negative for - cough, hemoptysis, shortness of breath or wheezing Cardiovascular ROS: negative for - chest pain, dyspnea on exertion, edema or irregular heartbeat Gastrointestinal ROS: negative for - abdominal pain, diarrhea, hematemesis, nausea/vomiting or stool incontinence Genito-Urinary ROS: negative for - dysuria, hematuria, incontinence or urinary frequency/urgency Musculoskeletal ROS: negative for - joint swelling or muscular weakness Neurological ROS: as noted in HPI Dermatological ROS: negative for rash and skin lesion changes  Physical Examination: Blood pressure 125/90, pulse 67, temperature 97.7 F (36.5 C), temperature source Oral, resp. rate 18,  height 5\' 11"  (1.803 m), weight 83.915 kg (185 lb), SpO2 96 %.  HEENT-  Normocephalic, no lesions, without obvious abnormality.  Normal external eye and conjunctiva.  Normal TM's bilaterally.  Normal auditory canals and external ears. Normal external nose, mucus membranes and septum.  Normal pharynx. Neck supple  with no masses, nodes, nodules or enlargement. Cardiovascular - regular rate and rhythm, S1, S2 normal, no murmur, click, rub or gallop Lungs - chest clear, no wheezing, rales, normal symmetric air entry Abdomen - soft, non-tender; bowel sounds normal; no masses,  no organomegaly Extremities - no joint deformities, effusion, or inflammation, no edema and no skin discoloration  Neurologic Examination: Mental Status: Alert, oriented, thought content appropriate.  Speech fluent without evidence of aphasia. Able to follow commands without difficulty. Cranial Nerves: II-Visual fields were normal. III/IV/VI-Pupils were equal and reacted normally to light. Extraocular movements were full and conjugate.    V/VII-no facial numbness and no facial weakness. VIII-normal. X-normal speech and symmetrical palatal movement. XI: trapezius strength/neck flexion strength normal bilaterally XII-midline tongue extension with normal strength. Motor: 5/5 bilaterally with normal tone and bulk Sensory: Normal throughout. Deep Tendon Reflexes: 2+ and symmetric. Plantars: Flexor bilaterally Cerebellar: Normal finger-to-nose testing. Carotid auscultation: Normal  Results for orders placed or performed during the hospital encounter of 04/05/15 (from the past 48 hour(s))  I-Stat Chem 8, ED  (not at [facility], [facility])     Status: Abnormal   Collection Time: 04/05/15  6:26 PM  Result Value Ref Range   Sodium 140 135 - 145 mmol/L   Potassium 4.3 3.5 - 5.1 mmol/L   Chloride 102 101 - 111 mmol/L   BUN 29 (H) 6 - 20 mg/dL   Creatinine, Ser 1.10 0.61 - 1.24 mg/dL   Glucose, Bld 110 (H) 65 - 99 mg/dL   Calcium, Ion 1.20 1.12 - 1.23 mmol/L   TCO2 28 0 - 100 mmol/L   Hemoglobin 16.0 13.0 - 17.0 g/dL   HCT 47.0 39.0 - 52.0 %  I-stat troponin, ED (not at [facility], [facility])     Status: None   Collection Time: 04/05/15  8:35 PM  Result Value Ref Range   Troponin i, poc 0.00 0.00 - 0.08 ng/mL   Comment 3            Comment: Due to  the release kinetics of cTnI, a negative result within the first hours of the onset of symptoms does not rule out myocardial infarction with certainty. If myocardial infarction is still suspected, repeat the test at appropriate intervals.   Protime-INR     Status: None   Collection Time: 04/05/15  8:36 PM  Result Value Ref Range   Prothrombin Time 14.4 11.6 - 15.2 seconds   INR 1.10 0.00 - 1.49  APTT     Status: None   Collection Time: 04/05/15  8:36 PM  Result Value Ref Range   aPTT 33 24 - 37 seconds  CBC     Status: None   Collection Time: 04/05/15  8:36 PM  Result Value Ref Range   WBC 8.7 4.0 - 10.5 K/uL   RBC 4.82 4.22 - 5.81 MIL/uL   Hemoglobin 15.1 13.0 - 17.0 g/dL   HCT 45.3 39.0 - 52.0 %   MCV 94.0 78.0 - 100.0 fL   MCH 31.3 26.0 - 34.0 pg   MCHC 33.3 30.0 - 36.0 g/dL   RDW 13.5 11.5 - 15.5 %   Platelets 276 150 - 400 K/uL  Differential  Status: None   Collection Time: 04/05/15  8:36 PM  Result Value Ref Range   Neutrophils Relative % 53 43 - 77 %   Neutro Abs 4.6 1.7 - 7.7 K/uL   Lymphocytes Relative 37 12 - 46 %   Lymphs Abs 3.3 0.7 - 4.0 K/uL   Monocytes Relative 7 3 - 12 %   Monocytes Absolute 0.6 0.1 - 1.0 K/uL   Eosinophils Relative 3 0 - 5 %   Eosinophils Absolute 0.2 0.0 - 0.7 K/uL   Basophils Relative 0 0 - 1 %   Basophils Absolute 0.0 0.0 - 0.1 K/uL   No results found.  Assessment: 59 y.o. male presenting with possible TIA or small cortical right MCA territory infarction.  Stroke Risk Factors - family history  Plan: 1. HgbA1c, fasting lipid panel 2. MRI, MRA  of the brain without contrast 3. PT consult, OT consult, Speech consult 4. Echocardiogram 5. Carotid dopplers 6. Prophylactic therapy-Antiplatelet med: Aspirin  7. Risk factor modification 8. Telemetry monitoring  C.R. Nicole Kindred, MD [facility] [phone]  04/05/2015, 8:59 PM
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
## 2015-04-05 NOTE — ED Notes (Signed)
Hospitalist at bedside 
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
## 2015-04-06 ENCOUNTER — Observation Stay (HOSPITAL_BASED_OUTPATIENT_CLINIC_OR_DEPARTMENT_OTHER): Payer: BLUE CROSS/BLUE SHIELD

## 2015-04-06 DIAGNOSIS — E780 Pure hypercholesterolemia: Secondary | ICD-10-CM

## 2015-04-06 DIAGNOSIS — F419 Anxiety disorder, unspecified: Secondary | ICD-10-CM

## 2015-04-06 DIAGNOSIS — G459 Transient cerebral ischemic attack, unspecified: Secondary | ICD-10-CM

## 2015-04-06 LAB — LIPID PANEL
CHOLESTEROL: 195 mg/dL (ref 0–200)
HDL: 42 mg/dL (ref >40)
LDL Cholesterol: 131 mg/dL — ABNORMAL HIGH (ref 0–99)
Total CHOL/HDL Ratio: 4.6 RATIO
Triglycerides: 109 mg/dL (ref <150)
VLDL: 22 mg/dL (ref 0–40)

## 2015-04-06 MED ORDER — ATORVASTATIN CALCIUM 40 MG PO TABS: 40.0000 mg | ORAL_TABLET | Freq: Every day | ORAL | Status: DC

## 2015-04-06 NOTE — Progress Notes (Signed)
Echocardiogram 2D Echocardiogram has been performed.  Tresa Res 04/06/2015, 12:10 PM

## 2015-04-06 NOTE — Progress Notes (Signed)
OT Cancellation Note  Patient Details Name: Brian Houston MRN: [id number] DOB: 07-29-1956   Cancelled Treatment:    Reason Eval/Treat Not Completed: OT screened, no needs identified, will sign off -  MRI negative for acute infarct, and per PT, pt has returned to baseline.   Darlina Rumpf [place], OTR/L [phone]  04/06/2015, 12:00 PM

## 2015-04-06 NOTE — Progress Notes (Signed)
[facility] PROGRESS NOTE   Brian Houston GBT:[id number] DOB: [birth year] DOA: 04/05/2015 PCP: Reginia Forts, MD  HPI/Subjective: Feels better, back to his baseline.  Assessment/Plan: Principal Problem:   TIA (transient ischemic attack) Active Problems:   Anxiety   Pure hypercholesterolemia   Left-sided weakness Patient presented with left-sided weakness which is resolved by now. Patient is on low-dose aspirin at home. CT and MRI of the brain did not show acute changes. Neurology consulted and recommended TIA/stroke workup. Stroke workup including: 1. HgbA1c, fasting lipid panel 2. MRI, MRA of the brain without contrast 3. PT consult, OT consult, Speech consult 4. Echocardiogram 5. Carotid dopplers 6. Prophylactic therapy-Antiplatelet med: Aspirin  7. Risk factor modification 8. Telemetry monitoring  Anxiety Patient is on Klonopin as needed, continued.  Dyslipidemia Total cholesterol is 195 and LDL is 131, started patient on statin.   Code Status: Full Code Family Communication: Plan discussed with the patient. Disposition Plan: Remains inpatient Diet: Diet regular Room service appropriate?: Yes; Fluid consistency:: Thin  Consultants:  Neuro  Procedures: None  Antibiotics:  None   Objective: Filed Vitals:   04/06/15 0920  BP: 104/57  Pulse: 62  Temp: 98 F (36.7 C)  Resp: 18    Intake/Output Summary (Last 24 hours) at 04/06/15 1025 Last data filed at 04/06/15 0830  Gross per 24 hour  Intake    240 ml  Output    500 ml  Net   -260 ml   Filed Weights   04/05/15 1755 04/05/15 2309  Weight: 83.915 kg (185 lb) 88.089 kg (194 lb 3.2 oz)    Exam: General: Alert and awake, oriented x3, not in any acute distress. HEENT: anicteric sclera, pupils reactive to light and accommodation, EOMI CVS: S1-S2 clear, no murmur rubs or gallops Chest: clear to auscultation bilaterally, no wheezing, rales or rhonchi Abdomen: soft nontender, nondistended,  normal bowel sounds, no organomegaly Extremities: no cyanosis, clubbing or edema noted bilaterally Neuro: Cranial nerves II-XII intact, no focal neurological deficits  Data Reviewed: Basic Metabolic Panel:  Recent Labs Lab 04/05/15 1826 04/05/15 2036  NA 140 136  139  K 4.3 4.6  4.7  CL 102 104  103  CO2  --  25  GLUCOSE 110* 104*  101*  BUN 29* 20  30*  CREATININE 1.10 1.09  1.10  CALCIUM  --  8.7*   Liver Function Tests:  Recent Labs Lab 04/05/15 2036  AST 23  ALT 31  ALKPHOS 53  BILITOT 0.5  PROT 6.8  ALBUMIN 3.9   No results for input(s): LIPASE, AMYLASE in the last 168 hours. No results for input(s): AMMONIA in the last 168 hours. CBC:  Recent Labs Lab 04/05/15 1826 04/05/15 2036  WBC  --  8.7  NEUTROABS  --  4.6  HGB 16.0 15.6  15.1  HCT 47.0 46.0  45.3  MCV  --  94.0  PLT  --  276   Cardiac Enzymes: No results for input(s): CKTOTAL, CKMB, CKMBINDEX, TROPONINI in the last 168 hours. BNP (last 3 results) No results for input(s): BNP in the last 8760 hours.  ProBNP (last 3 results) No results for input(s): PROBNP in the last 8760 hours.  CBG: No results for input(s): GLUCAP in the last 168 hours.  Micro No results found for this or any previous visit (from the past 240 hour(s)).   Studies: Ct Head Wo Contrast  04/05/2015   CLINICAL DATA:  Code stroke. Intermittent lightheadedness, heaviness and unsteady gait. Initial encounter.  EXAM:  CT HEAD WITHOUT CONTRAST  CT CERVICAL SPINE WITHOUT CONTRAST  TECHNIQUE: Multidetector CT imaging of the head and cervical spine was performed following the standard protocol without intravenous contrast. Multiplanar CT image reconstructions of the cervical spine were also generated.  COMPARISON:  CT of the head performed 05/02/2014, and MRI/MRA of the brain performed 07/14/2006  FINDINGS: CT HEAD FINDINGS  There is no evidence of acute infarction, mass lesion, or intra- or extra-axial hemorrhage on CT.   Scattered periventricular white matter change likely reflects small vessel ischemic microangiopathy.  The posterior fossa, including the cerebellum, brainstem and fourth ventricle, is within normal limits. The third and lateral ventricles, and basal ganglia are unremarkable in appearance. The cerebral hemispheres are symmetric in appearance, with normal gray-white differentiation. No mass effect or midline shift is seen.  There is no evidence of fracture; visualized osseous structures are unremarkable in appearance. The orbits are within normal limits. The paranasal sinuses and mastoid air cells are well-aerated. No significant soft tissue abnormalities are seen.  CT CERVICAL SPINE FINDINGS  There is no evidence of fracture or subluxation. Vertebral bodies demonstrate normal height and alignment. There is mild disc space narrowing at C5-C6, with small anterior and posterior disc osteophyte complexes. Prevertebral soft tissues are within normal limits. The visualized neural foramina are grossly unremarkable.  The thyroid gland is unremarkable in appearance. The visualized lung apices are clear. No significant soft tissue abnormalities are seen.  IMPRESSION: 1. No evidence of traumatic intracranial injury or fracture. 2. No evidence of fracture or subluxation along the cervical spine. 3. Minimal degenerative change at C5-C6. 4. Scattered small vessel ischemic microangiopathy. These results were called by telephone at the time of interpretation on 04/05/2015 at 8:49 pm to Dr. Nicole Kindred, who verbally acknowledged these results.   Electronically Signed   By: Garald Balding M.D.   On: 04/05/2015 20:58   Ct Cervical Spine Wo Contrast  04/05/2015   CLINICAL DATA:  Code stroke. Intermittent lightheadedness, heaviness and unsteady gait. Initial encounter.  EXAM: CT HEAD WITHOUT CONTRAST  CT CERVICAL SPINE WITHOUT CONTRAST  TECHNIQUE: Multidetector CT imaging of the head and cervical spine was performed following the standard  protocol without intravenous contrast. Multiplanar CT image reconstructions of the cervical spine were also generated.  COMPARISON:  CT of the head performed 05/02/2014, and MRI/MRA of the brain performed 07/14/2006  FINDINGS: CT HEAD FINDINGS  There is no evidence of acute infarction, mass lesion, or intra- or extra-axial hemorrhage on CT.  Scattered periventricular white matter change likely reflects small vessel ischemic microangiopathy.  The posterior fossa, including the cerebellum, brainstem and fourth ventricle, is within normal limits. The third and lateral ventricles, and basal ganglia are unremarkable in appearance. The cerebral hemispheres are symmetric in appearance, with normal gray-white differentiation. No mass effect or midline shift is seen.  There is no evidence of fracture; visualized osseous structures are unremarkable in appearance. The orbits are within normal limits. The paranasal sinuses and mastoid air cells are well-aerated. No significant soft tissue abnormalities are seen.  CT CERVICAL SPINE FINDINGS  There is no evidence of fracture or subluxation. Vertebral bodies demonstrate normal height and alignment. There is mild disc space narrowing at C5-C6, with small anterior and posterior disc osteophyte complexes. Prevertebral soft tissues are within normal limits. The visualized neural foramina are grossly unremarkable.  The thyroid gland is unremarkable in appearance. The visualized lung apices are clear. No significant soft tissue abnormalities are seen.  IMPRESSION: 1. No evidence of traumatic intracranial  injury or fracture. 2. No evidence of fracture or subluxation along the cervical spine. 3. Minimal degenerative change at C5-C6. 4. Scattered small vessel ischemic microangiopathy. These results were called by telephone at the time of interpretation on 04/05/2015 at 8:49 pm to Dr. Nicole Kindred, who verbally acknowledged these results.   Electronically Signed   By: Garald Balding M.D.   On:  04/05/2015 20:58   Mr Brain Wo Contrast  04/05/2015   CLINICAL DATA:  LEFT-sided weakness beginning at noon today, resolved by 6 p.m. Gait imbalance, vertigo. History of anxiety.  EXAM: MRI HEAD WITHOUT CONTRAST  TECHNIQUE: Multiplanar, multiecho pulse sequences of the brain and surrounding structures were obtained without intravenous contrast.  COMPARISON:  CT head April 05, 2015 at 2032 hours  FINDINGS: The ventricles and sulci are normal for patient's age. No abnormal parenchymal signal, mass lesions, mass effect. No reduced diffusion to suggest acute ischemia. Scattered subcentimeter supratentorial white matter T2 hyperintensities predominately and deep and subcortical distribution with. No susceptibility artifact to suggest hemorrhage.  No abnormal extra-axial fluid collections. No extra-axial masses though, contrast enhanced sequences would be more sensitive. Normal major intracranial vascular flow voids seen at the skull base.  Ocular globes and orbital contents are unremarkable though not tailored for evaluation. No abnormal sellar expansion. Small LEFT maxillary mucosal retention cysts without paranasal sinus air-fluid levels. The mastoid air cells are well aerated. No suspicious calvarial bone marrow signal. No abnormal sellar expansion. Craniocervical junction maintained.  IMPRESSION: No acute intracranial process, specifically no acute ischemia.  Mild white matter changes can be seen with chronic small vessel ischemic disease, migrainous disorder, vasculopathy.   Electronically Signed   By: Elon Alas M.D.   On: 04/05/2015 23:09   Mr Jodene Nam Head/brain Wo Cm  04/06/2015   CLINICAL DATA:  TIA.  Left-sided weakness  EXAM: MRA HEAD WITHOUT CONTRAST  TECHNIQUE: Angiographic images of the Circle of Willis were obtained using MRA technique without intravenous contrast.  COMPARISON:  MRI head 04/05/2015  FINDINGS: Both vertebral arteries patent to the basilar. PICA patent bilaterally. Basilar widely  patent. Superior cerebellar and posterior cerebral arteries patent bilaterally  Cavernous carotid widely patent bilaterally without stenosis or aneurysm. Anterior and middle cerebral arteries widely patent bilaterally without stenosis or aneurysm.  IMPRESSION: Negative   Electronically Signed   By: Franchot Gallo M.D.   On: 04/06/2015 09:40    Scheduled Meds: . aspirin  325 mg Oral Daily  . enoxaparin (LOVENOX) injection  40 mg Subcutaneous Daily   Continuous Infusions:      Time spent: 35 minutes    [facility]  [facility] Pager [phone] If 7PM-7AM, please contact night-coverage at [email or web address], password [email or web address] 04/06/2015, 10:25 AM

## 2015-04-06 NOTE — Evaluation (Signed)
Physical Therapy Evaluation and Discharge Patient Details Name: Brian Houston MRN: [id number] DOB: May 08, 1956 Today's Date: 04/06/2015   History of Present Illness  Patient is a 59 year old male with history of anxiety who came with cc of left side weakness that started around noon today and spread from left arm to left leg but was waxing and waning with intermittent improvement until complete resolution around 6 pm. He thought initially it's anxiety attack so he took clonazepam dose but did not take any other meds. He denied any facial droop or slurred speech. He did eat after that incident and had no dysphagia. Other symptoms included imbalance with vertigo sensation which has been occuring intermittently for a month and left foot "pins and needles" sensation.   Clinical Impression  Pt functioning at baseline. Pt scored 24/24 on DGI indicating minimal falls risk. Pt with no further acute PT needs at this time. PT SIGNING OFF. Please re-consult if needed in future.    Follow Up Recommendations No PT follow up    Equipment Recommendations  None recommended by PT    Recommendations for Other Services       Precautions / Restrictions Precautions Precautions: Fall Restrictions Weight Bearing Restrictions: No      Mobility  Bed Mobility Overal bed mobility: Independent                Transfers Overall transfer level: Independent Equipment used: None             General transfer comment: safe technique,no instability  Ambulation/Gait Ambulation/Gait assistance: Independent Ambulation Distance (Feet): 500 Feet Assistive device: None Gait Pattern/deviations: WFL(Within Functional Limits) Gait velocity: wfl Gait velocity interpretation: at or above normal speed for age/gender General Gait Details: no episodes of LOB, no report of lightheaded/dizziness  Stairs Stairs: Yes Stairs assistance: Independent Stair Management: No rails;Alternating pattern Number of Stairs:  2 General stair comments: no instability  Wheelchair Mobility    Modified Rankin (Stroke Patients Only) Modified Rankin (Stroke Patients Only) Pre-Morbid Rankin Score: No symptoms Modified Rankin: No symptoms     Balance Overall balance assessment: Independent                               Standardized Balance Assessment Standardized Balance Assessment : Dynamic Gait Index   Dynamic Gait Index Level Surface: Normal Change in Gait Speed: Normal Gait with Horizontal Head Turns: Normal Gait with Vertical Head Turns: Normal Gait and Pivot Turn: Normal Step Over Obstacle: Normal Step Around Obstacles: Normal Steps: Normal Total Score: 24       Pertinent Vitals/Pain Pain Assessment: No/denies pain    Home Living Family/patient expects to be discharged to:: Private residence Living Arrangements: Spouse/significant other Available Help at Discharge: Family;Available 24 hours/day Type of Home: House Home Access: Level entry     Home Layout: One level Home Equipment: None      Prior Function Level of Independence: Independent         Comments: pt is a Manufacturing systems engineer [employer]   Dominant Hand: Right    Extremity/Trunk Assessment   Upper Extremity Assessment: Overall WFL for tasks assessed           Lower Extremity Assessment: Overall WFL for tasks assessed      Cervical / Trunk Assessment: Normal  Communication   Communication: No difficulties  Cognition Arousal/Alertness: Awake/alert Behavior During Therapy: WFL for tasks assessed/performed Overall Cognitive Status: Within Functional  Limits for tasks assessed                      General Comments      Exercises        Assessment/Plan    PT Assessment Patent does not need any further PT services  PT Diagnosis Generalized weakness   PT Problem List    PT Treatment Interventions     PT Goals (Current goals can be found in the Care Plan section) Acute Rehab PT  Goals Patient Stated Goal: home ASAP PT Goal Formulation: All assessment and education complete, DC therapy    Frequency     Barriers to discharge        Co-evaluation               End of Session   Activity Tolerance: Patient tolerated treatment well Patient left: in bed;with call bell/phone within reach (MD present) Nurse Communication: Mobility status (pt d/c'd from PT)    Functional Assessment Tool Used: clinical judgement Functional Limitation: Mobility: Walking and moving around Mobility: Walking and Moving Around Current Status [id number]): 0 percent impaired, limited or restricted Mobility: Walking and Moving Around Goal Status [phone]): 0 percent impaired, limited or restricted Mobility: Walking and Moving Around Discharge Status [phone]): 0 percent impaired, limited or restricted    Time: 0759-0819 PT Time Calculation (min) (ACUTE ONLY): 20 min   Charges:   PT Evaluation $Initial PT Evaluation Tier I: 1 Procedure     PT G Codes:   PT G-Codes **NOT FOR INPATIENT CLASS** Functional Assessment Tool Used: clinical judgement Functional Limitation: Mobility: Walking and moving around Mobility: Walking and Moving Around Current Status (B7169): 0 percent impaired, limited or restricted Mobility: Walking and Moving Around Goal Status (C7893): 0 percent impaired, limited or restricted Mobility: Walking and Moving Around Discharge Status [phone]): 0 percent impaired, limited or restricted    Kingsley Callander 04/06/2015, 8:34 AM  Kittie Plater, PT, DPT Pager #: [phone] Office #: [phone]
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
## 2015-04-07 ENCOUNTER — Observation Stay (HOSPITAL_BASED_OUTPATIENT_CLINIC_OR_DEPARTMENT_OTHER): Payer: BLUE CROSS/BLUE SHIELD

## 2015-04-07 DIAGNOSIS — E780 Pure hypercholesterolemia: Secondary | ICD-10-CM | POA: Diagnosis not present

## 2015-04-07 DIAGNOSIS — G459 Transient cerebral ischemic attack, unspecified: Secondary | ICD-10-CM | POA: Diagnosis not present

## 2015-04-07 DIAGNOSIS — F419 Anxiety disorder, unspecified: Secondary | ICD-10-CM | POA: Diagnosis not present

## 2015-04-07 MED ORDER — ASPIRIN 325 MG PO TBEC
325.0000 mg | DELAYED_RELEASE_TABLET | Freq: Every day | ORAL | Status: DC
Start: 2015-04-07 — End: 2016-06-30

## 2015-04-07 MED ORDER — ROSUVASTATIN CALCIUM 20 MG PO TABS: 20.0000 mg | ORAL_TABLET | Freq: Every day | ORAL | Status: DC

## 2015-04-07 NOTE — Progress Notes (Signed)
STROKE TEAM PROGRESS NOTE   SUBJECTIVE (INTERVAL HISTORY) Pt was discharge without seeing this am.    OBJECTIVE Temp:  [97.7 F (36.5 C)-98.1 F (36.7 C)] 98.1 F (36.7 C) (09/05 0526) Pulse Rate:  [53-65] 59 (09/05 0526) Cardiac Rhythm:  [-] Normal sinus rhythm (09/04 1900) Resp:  [16-18] 18 (09/05 0526) BP: (99-112)/(60-70) 99/70 mmHg (09/05 0526) SpO2:  [94 %-100 %] 100 % (09/05 0526)  CBC:  Recent Labs Lab 04/05/15 1826 04/05/15 2036  WBC  --  8.7  NEUTROABS  --  4.6  HGB 16.0 15.6  15.1  HCT 47.0 46.0  45.3  MCV  --  94.0  PLT  --  433    Basic Metabolic Panel:  Recent Labs Lab 04/05/15 1826 04/05/15 2036  NA 140 136  139  K 4.3 4.6  4.7  CL 102 104  103  CO2  --  25  GLUCOSE 110* 104*  101*  BUN 29* 20  30*  CREATININE 1.10 1.09  1.10  CALCIUM  --  8.7*    Lipid Panel:    Component Value Date/Time   CHOL 195 04/06/2015 0633   TRIG 109 04/06/2015 0633   HDL 42 04/06/2015 0633   CHOLHDL 4.6 04/06/2015 0633   VLDL 22 04/06/2015 0633   LDLCALC 131* 04/06/2015 0633   HgbA1c:  Lab Results  Component Value Date   HGBA1C 5.8* 09/09/2014   Urine Drug Screen:    Component Value Date/Time   LABOPIA NONE DETECTED 04/05/2015 2129   COCAINSCRNUR NONE DETECTED 04/05/2015 2129   LABBENZ NONE DETECTED 04/05/2015 2129   AMPHETMU NONE DETECTED 04/05/2015 2129   THCU NONE DETECTED 04/05/2015 2129   LABBARB NONE DETECTED 04/05/2015 2129      IMAGING  Ct Head & Cervical Spine Wo Contrast 04/05/2015   1. No evidence of traumatic intracranial injury or fracture. 2. No evidence of fracture or subluxation along the cervical spine. 3. Minimal degenerative change at C5-C6. 4. Scattered small vessel ischemic microangiopathy.   Mr Brain Wo Contrast 04/05/2015   No acute intracranial process, specifically no acute ischemia.  Mild white matter changes can be seen with chronic small vessel ischemic disease, migrainous disorder, vasculopathy.     Mr Jodene Nam  Head/brain Wo Cm 04/06/2015   Negative     2D echo - - Left ventricle: The cavity size was normal. Wall thickness was normal. Systolic function was normal. The estimated ejection fraction was in the range of 50% to 55%. - Mitral valve: There was mild regurgitation.  CUS - Bilateral: 1-39% ICA stenosis. Vertebral artery flow is antegrade.   PHYSICAL EXAM Pt was discharged in am before being seen.  ASSESSMENT/PLAN [name] is a 59 y.o. male with history of anxiety,  depression and GERD presenting with new onset feeling of heaviness involving his left side and associated lightheadedness.Marland Kitchen He did not receive IV t-PA due to no clinical deficit.   TIA vs. Anxiety vs. Panic attach  Resultant  Resolution of deficit  MRI  No acute stroke  MRA  Unremarkable   Carotid Doppler  unremarkable  2D Echo  EF 50-55%   LDL 131  HgbA1c pending  Lovenox 40 mg sq daily for VTE prophylaxis  Diet regular Room service appropriate?: Yes; Fluid consistency:: Thin  no antithrombotic prior to admission, now on aspirin 325 mg orally every day  Patient counseled to be compliant with his antithrombotic medications  Ongoing aggressive stroke risk factor management  Hyperlipidemia  Home meds:  Fish oil,  resumed in hospital  LDL 131, goal < 70  Add crestor 20mg   Continue statin at discharge  Other Stroke Risk Factors  Former Cigarette smoker, quit 30 years ago  Family hx stroke (father)  Other [facility] day #   Pt was discharged before being seen. I did not bill for the encounter. No neuro follow up needed.   Rosalin Hawking, MD PhD Stroke Neurology 04/07/2015 4:32 PM   To contact Stroke Continuity provider, please refer to [email or web address] After hours, contact General Neurology

## 2015-04-07 NOTE — Progress Notes (Signed)
VASCULAR LAB PRELIMINARY  PRELIMINARY  PRELIMINARY  PRELIMINARY  Carotid duplex completed.    Preliminary report:  1-39% plaquing noted.  Vertebral artery flow is antegrade.   Brian Houston, RVT 04/07/2015, 10:26 AM

## 2015-04-07 NOTE — Progress Notes (Signed)
D/C orders received, pt for D/C home today.  IV and telemetry D/C.  Rx and D/C instructions given with verbalized understanding.  Family at bedside to assist with D/C.  Staff brought pt downstairs via wheelchair.  

## 2015-04-07 NOTE — Evaluation (Signed)
SLP Cancellation Note  Patient Details Name: Brian Houston MRN: [id number] DOB: 18-Feb-1956   Cancelled treatment:       Reason Eval/Treat Not Completed: SLP screened, no needs identified, will sign off   Luanna Salk, [facility] SLP [phone]

## 2015-04-07 NOTE — Discharge Summary (Signed)
Physician Discharge Summary  Brian Houston TFT:[id number] DOB: 09/02/1955 DOA: 04/05/2015  PCP: Reginia Forts, MD  Admit date: 04/05/2015 Discharge date: 04/07/2015  Time spent: 40 minutes  Recommendations for Outpatient Follow-up:  1. Follow-up with primary care physician within one week.  Discharge Diagnoses:  Principal Problem:   TIA (transient ischemic attack) Active Problems:   Anxiety   Pure hypercholesterolemia   Discharge Condition: Stable  Diet recommendation: Heart healthy  Filed Weights   04/05/15 1755 04/05/15 2309  Weight: 83.915 kg (185 lb) 88.089 kg (194 lb 3.2 oz)    History of present illness:  Patient is a 59 year old male with history of anxiety who came with cc of left side weakness that started around noon today and spread from left arm to left leg but was waxing and waning with intermittent improvement until complete resolution around 6 pm. He thought initially it's anxiety attack so he took clonazepam dose but did not take any other meds. He denied any facial droop or slurred speech. He did eat after that incident and had no dysphagia. Other symptoms included imbalance with vertigo sensation which has been occuring intermittently for a month and left foot "pins and needles" sensation.   Hospital Course:   Left-sided weakness Patient presented with left-sided weakness which is resolved by now. Patient is on low-dose aspirin at home. CT and MRI of the brain did not show acute changes. Neurology consulted and recommended TIA/stroke workup. Stroke workup including: 1. HgbA1c is 5.8, fasting lipid panel, see below for dyslipidemia 2. MRI, MRA of the brain without contrast no acute changes 3. PT consult, OT consult, Speech consult: Recommended no follow-up 4. Echocardiogram: No source of emboli 5. Carotid dopplers: Preliminary report showed 139% stenosis bilaterally 6. Prophylactic therapy-Antiplatelet med: Aspirin was on 81 mg, recommended 325 mg by  neurology. 7. Risk factor modification control cholesterol, added statin. Per neurology possible TIA.  Anxiety Patient is on Klonopin as needed, continued.  Dyslipidemia Total cholesterol is 195 and LDL is 131, takes fish oral at home. Patient had intolerance to multiple statins, though when he remembers as atorvastatin and simvastatin. Placed on low-dose of Crestor, patient follow-up with PCP in case he had any symptoms.  Procedures:  2-D echo showed LVEF of 55%, mild mitral regurgitation but no source of emboli.  Current duplex preliminary and 1-39% stenosis bilaterally, vertebrals flow is antegrade.  Consultations:  Neurology  Discharge Exam: Filed Vitals:   04/07/15 0941  BP: 107/60  Pulse: 61  Temp: 97.9 F (36.6 C)  Resp: 18   General: Alert and awake, oriented x3, not in any acute distress. HEENT: anicteric sclera, pupils reactive to light and accommodation, EOMI CVS: S1-S2 clear, no murmur rubs or gallops Chest: clear to auscultation bilaterally, no wheezing, rales or rhonchi Abdomen: soft nontender, nondistended, normal bowel sounds, no organomegaly Extremities: no cyanosis, clubbing or edema noted bilaterally Neuro: Cranial nerves II-XII intact, no focal neurological deficits  Discharge Instructions   Discharge Instructions    Diet - low sodium heart healthy    Complete by:  As directed      Increase activity slowly    Complete by:  As directed           Current Discharge Medication List    START taking these medications   Details  rosuvastatin (CRESTOR) 20 MG tablet Take 1 tablet (20 mg total) by mouth daily. Qty: 30 tablet, Refills: 0      CONTINUE these medications which have CHANGED   Details  aspirin EC 325 MG EC tablet Take 1 tablet (325 mg total) by mouth daily.      CONTINUE these medications which have NOT CHANGED   Details  clonazePAM (KLONOPIN) 0.5 MG tablet Take 1/2-1 as needed once daily for anxiety Qty: 30 tablet, Refills: 5    Associated Diagnoses: Anxiety    fish oil-omega-3 fatty acids 1000 MG capsule Take 2 g by mouth daily.    fluticasone (FLONASE) 50 MCG/ACT nasal spray Place 2 sprays into both nostrils daily. Qty: 16 g, Refills: 1   Associated Diagnoses: Sinus congestion; Post-nasal drainage    sertraline (ZOLOFT) 50 MG tablet Take 1 tablet (50 mg total) by mouth daily. Qty: 90 tablet, Refills: 1      STOP taking these medications     ibuprofen (ADVIL,MOTRIN) 200 MG tablet        Allergies  Allergen Reactions  . Remeron [Mirtazapine] Other (See Comments)    Restless legs  . Omeprazole Rash  . Penicillins Rash   Follow-up Information    Follow up with SMITH,KRISTI, MD In 1 week.   Specialty:  Family Medicine   Contact information:   [place] [id number] [phone]        The results of significant diagnostics from this hospitalization (including imaging, microbiology, ancillary and laboratory) are listed below for reference.    Significant Diagnostic Studies: Ct Head Wo Contrast  04/05/2015   CLINICAL DATA:  Code stroke. Intermittent lightheadedness, heaviness and unsteady gait. Initial encounter.  EXAM: CT HEAD WITHOUT CONTRAST  CT CERVICAL SPINE WITHOUT CONTRAST  TECHNIQUE: Multidetector CT imaging of the head and cervical spine was performed following the standard protocol without intravenous contrast. Multiplanar CT image reconstructions of the cervical spine were also generated.  COMPARISON:  CT of the head performed 05/02/2014, and MRI/MRA of the brain performed 07/14/2006  FINDINGS: CT HEAD FINDINGS  There is no evidence of acute infarction, mass lesion, or intra- or extra-axial hemorrhage on CT.  Scattered periventricular white matter change likely reflects small vessel ischemic microangiopathy.  The posterior fossa, including the cerebellum, brainstem and fourth ventricle, is within normal limits. The third and lateral ventricles, and basal ganglia are unremarkable in  appearance. The cerebral hemispheres are symmetric in appearance, with normal gray-white differentiation. No mass effect or midline shift is seen.  There is no evidence of fracture; visualized osseous structures are unremarkable in appearance. The orbits are within normal limits. The paranasal sinuses and mastoid air cells are well-aerated. No significant soft tissue abnormalities are seen.  CT CERVICAL SPINE FINDINGS  There is no evidence of fracture or subluxation. Vertebral bodies demonstrate normal height and alignment. There is mild disc space narrowing at C5-C6, with small anterior and posterior disc osteophyte complexes. Prevertebral soft tissues are within normal limits. The visualized neural foramina are grossly unremarkable.  The thyroid gland is unremarkable in appearance. The visualized lung apices are clear. No significant soft tissue abnormalities are seen.  IMPRESSION: 1. No evidence of traumatic intracranial injury or fracture. 2. No evidence of fracture or subluxation along the cervical spine. 3. Minimal degenerative change at C5-C6. 4. Scattered small vessel ischemic microangiopathy. These results were called by telephone at the time of interpretation on 04/05/2015 at 8:49 pm to Dr. Nicole Kindred, who verbally acknowledged these results.   Electronically Signed   By: Garald Balding M.D.   On: 04/05/2015 20:58   Ct Cervical Spine Wo Contrast  04/05/2015   CLINICAL DATA:  Code stroke. Intermittent lightheadedness, heaviness and  unsteady gait. Initial encounter.  EXAM: CT HEAD WITHOUT CONTRAST  CT CERVICAL SPINE WITHOUT CONTRAST  TECHNIQUE: Multidetector CT imaging of the head and cervical spine was performed following the standard protocol without intravenous contrast. Multiplanar CT image reconstructions of the cervical spine were also generated.  COMPARISON:  CT of the head performed 05/02/2014, and MRI/MRA of the brain performed 07/14/2006  FINDINGS: CT HEAD FINDINGS  There is no evidence of acute  infarction, mass lesion, or intra- or extra-axial hemorrhage on CT.  Scattered periventricular white matter change likely reflects small vessel ischemic microangiopathy.  The posterior fossa, including the cerebellum, brainstem and fourth ventricle, is within normal limits. The third and lateral ventricles, and basal ganglia are unremarkable in appearance. The cerebral hemispheres are symmetric in appearance, with normal gray-white differentiation. No mass effect or midline shift is seen.  There is no evidence of fracture; visualized osseous structures are unremarkable in appearance. The orbits are within normal limits. The paranasal sinuses and mastoid air cells are well-aerated. No significant soft tissue abnormalities are seen.  CT CERVICAL SPINE FINDINGS  There is no evidence of fracture or subluxation. Vertebral bodies demonstrate normal height and alignment. There is mild disc space narrowing at C5-C6, with small anterior and posterior disc osteophyte complexes. Prevertebral soft tissues are within normal limits. The visualized neural foramina are grossly unremarkable.  The thyroid gland is unremarkable in appearance. The visualized lung apices are clear. No significant soft tissue abnormalities are seen.  IMPRESSION: 1. No evidence of traumatic intracranial injury or fracture. 2. No evidence of fracture or subluxation along the cervical spine. 3. Minimal degenerative change at C5-C6. 4. Scattered small vessel ischemic microangiopathy. These results were called by telephone at the time of interpretation on 04/05/2015 at 8:49 pm to Dr. Nicole Kindred, who verbally acknowledged these results.   Electronically Signed   By: Garald Balding M.D.   On: 04/05/2015 20:58   Mr Brain Wo Contrast  04/05/2015   CLINICAL DATA:  LEFT-sided weakness beginning at noon today, resolved by 6 p.m. Gait imbalance, vertigo. History of anxiety.  EXAM: MRI HEAD WITHOUT CONTRAST  TECHNIQUE: Multiplanar, multiecho pulse sequences of the brain  and surrounding structures were obtained without intravenous contrast.  COMPARISON:  CT head April 05, 2015 at 2032 hours  FINDINGS: The ventricles and sulci are normal for patient's age. No abnormal parenchymal signal, mass lesions, mass effect. No reduced diffusion to suggest acute ischemia. Scattered subcentimeter supratentorial white matter T2 hyperintensities predominately and deep and subcortical distribution with. No susceptibility artifact to suggest hemorrhage.  No abnormal extra-axial fluid collections. No extra-axial masses though, contrast enhanced sequences would be more sensitive. Normal major intracranial vascular flow voids seen at the skull base.  Ocular globes and orbital contents are unremarkable though not tailored for evaluation. No abnormal sellar expansion. Small LEFT maxillary mucosal retention cysts without paranasal sinus air-fluid levels. The mastoid air cells are well aerated. No suspicious calvarial bone marrow signal. No abnormal sellar expansion. Craniocervical junction maintained.  IMPRESSION: No acute intracranial process, specifically no acute ischemia.  Mild white matter changes can be seen with chronic small vessel ischemic disease, migrainous disorder, vasculopathy.   Electronically Signed   By: Elon Alas M.D.   On: 04/05/2015 23:09   Mr Jodene Nam Head/brain Wo Cm  04/06/2015   CLINICAL DATA:  TIA.  Left-sided weakness  EXAM: MRA HEAD WITHOUT CONTRAST  TECHNIQUE: Angiographic images of the Circle of Willis were obtained using MRA technique without intravenous contrast.  COMPARISON:  MRI  head 04/05/2015  FINDINGS: Both vertebral arteries patent to the basilar. PICA patent bilaterally. Basilar widely patent. Superior cerebellar and posterior cerebral arteries patent bilaterally  Cavernous carotid widely patent bilaterally without stenosis or aneurysm. Anterior and middle cerebral arteries widely patent bilaterally without stenosis or aneurysm.  IMPRESSION: Negative    Electronically Signed   By: Franchot Gallo M.D.   On: 04/06/2015 09:40    Microbiology: No results found for this or any previous visit (from the past 240 hour(s)).   Labs: Basic Metabolic Panel:  Recent Labs Lab 04/05/15 1826 04/05/15 2036  NA 140 136  139  K 4.3 4.6  4.7  CL 102 104  103  CO2  --  25  GLUCOSE 110* 104*  101*  BUN 29* 20  30*  CREATININE 1.10 1.09  1.10  CALCIUM  --  8.7*   Liver Function Tests:  Recent Labs Lab 04/05/15 2036  AST 23  ALT 31  ALKPHOS 53  BILITOT 0.5  PROT 6.8  ALBUMIN 3.9   No results for input(s): LIPASE, AMYLASE in the last 168 hours. No results for input(s): AMMONIA in the last 168 hours. CBC:  Recent Labs Lab 04/05/15 1826 04/05/15 2036  WBC  --  8.7  NEUTROABS  --  4.6  HGB 16.0 15.6  15.1  HCT 47.0 46.0  45.3  MCV  --  94.0  PLT  --  276   Cardiac Enzymes: No results for input(s): CKTOTAL, CKMB, CKMBINDEX, TROPONINI in the last 168 hours. BNP: BNP (last 3 results) No results for input(s): BNP in the last 8760 hours.  ProBNP (last 3 results) No results for input(s): PROBNP in the last 8760 hours.  CBG: No results for input(s): GLUCAP in the last 168 hours.     Signed:  Genine Beckett A  [facility] 04/07/2015, 10:46 AM

## 2015-04-08 LAB — HEMOGLOBIN A1C
Hgb A1c MFr Bld: 6 % — ABNORMAL HIGH (ref 4.8–5.6)
Mean Plasma Glucose: 126 mg/dL

## 2015-04-09 ENCOUNTER — Telehealth: Payer: Self-pay | Admitting: Family Medicine

## 2015-04-09 NOTE — Telephone Encounter (Signed)
Patient discharged from hospital for TIA this week; needs hospital follow-up with me in one week.  Please schedule pt for hospital follow-up with me on Monday, 9/12 at 4:30pm; please overbook/double book me.

## 2015-04-09 NOTE — Telephone Encounter (Signed)
Called patient to let him know his appt date Sept 12th at 4:30 for f/u with Dr Tamala Julian

## 2015-04-14 ENCOUNTER — Encounter: Payer: Self-pay | Admitting: Family Medicine

## 2015-04-14 ENCOUNTER — Ambulatory Visit (INDEPENDENT_AMBULATORY_CARE_PROVIDER_SITE_OTHER): Payer: BLUE CROSS/BLUE SHIELD | Admitting: Family Medicine

## 2015-04-14 VITALS — BP 115/68 | HR 64 | Temp 97.8°F | Resp 16 | Wt 192.0 lb

## 2015-04-14 DIAGNOSIS — E7800 Pure hypercholesterolemia, unspecified: Secondary | ICD-10-CM

## 2015-04-14 DIAGNOSIS — R7302 Impaired glucose tolerance (oral): Secondary | ICD-10-CM

## 2015-04-14 DIAGNOSIS — M5030 Other cervical disc degeneration, unspecified cervical region: Secondary | ICD-10-CM | POA: Diagnosis not present

## 2015-04-14 DIAGNOSIS — S161XXA Strain of muscle, fascia and tendon at neck level, initial encounter: Secondary | ICD-10-CM | POA: Diagnosis not present

## 2015-04-14 DIAGNOSIS — R519 Headache, unspecified: Secondary | ICD-10-CM

## 2015-04-14 DIAGNOSIS — F32A Depression, unspecified: Secondary | ICD-10-CM

## 2015-04-14 DIAGNOSIS — R51 Headache: Secondary | ICD-10-CM | POA: Diagnosis not present

## 2015-04-14 DIAGNOSIS — F418 Other specified anxiety disorders: Secondary | ICD-10-CM | POA: Diagnosis not present

## 2015-04-14 DIAGNOSIS — Z8673 Personal history of transient ischemic attack (TIA), and cerebral infarction without residual deficits: Secondary | ICD-10-CM

## 2015-04-14 DIAGNOSIS — F329 Major depressive disorder, single episode, unspecified: Secondary | ICD-10-CM

## 2015-04-14 DIAGNOSIS — F419 Anxiety disorder, unspecified: Secondary | ICD-10-CM

## 2015-04-14 MED ORDER — CYCLOBENZAPRINE HCL 5 MG PO TABS: 5.0000 mg | ORAL_TABLET | Freq: Every day | ORAL | Status: DC

## 2015-04-14 NOTE — Progress Notes (Signed)
Subjective:    Patient ID: Brian Houston, male    DOB: 04/25/1956, 58 y.o.   MRN: [id number]  04/14/2015  hospital follow up; Transient Ischemic Attack; Headache; and Hyperlipidemia   HPI This 59 y.o. male presents for [place] from recent hospitalization for TIA.  Here is the discharge summary:  Admit date: 04/05/2015 Discharge date: 04/07/2015  Time spent: 40 minutes  Recommendations for Outpatient Follow-up:  1. Follow-up with primary care physician within one week.  Discharge Diagnoses:  Principal Problem:  TIA (transient ischemic attack) Active Problems:  Anxiety  Pure hypercholesterolemia   Discharge Condition: Stable  Diet recommendation: Heart healthy  Filed Weights   04/05/15 1755 04/05/15 2309  Weight: 83.915 kg (185 lb) 88.089 kg (194 lb 3.2 oz)    History of present illness:  Patient is a 59 year old male with history of anxiety who came with cc of left side weakness that started around noon today and spread from left arm to left leg but was waxing and waning with intermittent improvement until complete resolution around 6 pm. He thought initially it's anxiety attack so he took clonazepam dose but did not take any other meds. He denied any facial droop or slurred speech. He did eat after that incident and had no dysphagia. Other symptoms included imbalance with vertigo sensation which has been occuring intermittently for a month and left foot "pins and needles" sensation.   Hospital Course:   Left-sided weakness Patient presented with left-sided weakness which is resolved by now. Patient is on low-dose aspirin at home. CT and MRI of the brain did not show acute changes. Neurology consulted and recommended TIA/stroke workup. Stroke workup including: 1. HgbA1c is 5.8, fasting lipid panel, see below for dyslipidemia 2. MRI, MRA of the brain without contrast no acute changes 3. PT consult, OT consult, Speech consult: Recommended no  follow-up 4. Echocardiogram: No source of emboli 5. Carotid dopplers: Preliminary report showed 139% stenosis bilaterally 6. Prophylactic therapy-Antiplatelet med: Aspirin was on 81 mg, recommended 325 mg by neurology. 7. Risk factor modification control cholesterol, added statin. Per neurology possible TIA.  Anxiety Patient is on Klonopin as needed, continued.  Dyslipidemia Total cholesterol is 195 and LDL is 131, takes fish oral at home. Patient had intolerance to multiple statins, though when he remembers as atorvastatin and simvastatin. Placed on low-dose of Crestor, patient follow-up with PCP in case he had any symptoms.  Procedures:  2-D echo showed LVEF of 55%, mild mitral regurgitation but no source of emboli.  Current duplex preliminary and 1-39% stenosis bilaterally, vertebrals flow is antegrade.  Consultations:  Neurology  Discharge Exam: Filed Vitals:   04/07/15 0941  BP: 107/60  Pulse: 61  Temp: 97.9 F (36.6 C)  Resp: 18   General: Alert and awake, oriented x3, not in any acute distress. HEENT: anicteric sclera, pupils reactive to light and accommodation, EOMI CVS: S1-S2 clear, no murmur rubs or gallops Chest: clear to auscultation bilaterally, no wheezing, rales or rhonchi Abdomen: soft nontender, nondistended, normal bowel sounds, no organomegaly Extremities: no cyanosis, clubbing or edema noted bilaterally Neuro: Cranial nerves II-XII intact, no focal neurological deficits  Discharge Instructions   Discharge Instructions    Diet - low sodium heart healthy  Complete by: As directed      Increase activity slowly  Complete by: As directed           Current Discharge Medication List    START taking these medications   Details  rosuvastatin (  CRESTOR) 20 MG tablet Take 1 tablet (20 mg total) by mouth daily. Qty: 30 tablet, Refills: 0      CONTINUE these medications which have CHANGED   Details    aspirin EC 325 MG EC tablet Take 1 tablet (325 mg total) by mouth daily.      CONTINUE these medications which have NOT CHANGED   Details  clonazePAM (KLONOPIN) 0.5 MG tablet Take 1/2-1 as needed once daily for anxiety Qty: 30 tablet, Refills: 5   Associated Diagnoses: Anxiety    fish oil-omega-3 fatty acids 1000 MG capsule Take 2 g by mouth daily.    fluticasone (FLONASE) 50 MCG/ACT nasal spray Place 2 sprays into both nostrils daily. Qty: 16 g, Refills: 1   Associated Diagnoses: Sinus congestion; Post-nasal drainage    sertraline (ZOLOFT) 50 MG tablet Take 1 tablet (50 mg total) by mouth daily. Qty: 90 tablet, Refills: 1      STOP taking these medications     ibuprofen (ADVIL,MOTRIN) 200 MG tablet        Allergies  Allergen Reactions  . Remeron [Mirtazapine] Other (See Comments)    Restless legs  . Omeprazole Rash  . Penicillins Rash   Follow-up Information    Follow up with Slater Mcmanaman, MD In 1 week.   Specialty: Family Medicine   Contact information:   [place] [address] [phone]          A few months ago, developed acute sudden dizzy episode that felt like a loop that lasted just a second.  Sporadic.  Also developed frequent stress or tension headaches.  Also having migraines.  Also having some neck pain.  Some headaches improve with getting up and moving around. Other headaches were severe and worsened with movement.  Last weekend, took a motorcycle ride; all of a sudden, L arm started hurting and got heavy and hard to grip.  Never lost grip but felt heavy and had aching; had to let L hand hand. Then, L leg had a spell for a few seconds with quick resolution. Then also had weirdness in head; felt out of sync with things.  Came home; not sure what to do; go to ED or [facility].  Called [facility]; asked about availability; asked brother in law who advised to present to ED.  Admitted; sent all  results to [facility] to MD there.  Did not fill Crestor at discharge; told hospitalist that pt cannot tolerate statins.  Tried Zocor, Crestor, Lipitor; could not tolerate medications.  Feels great; felt good when went to ED; did have residual L arm weirdness.  Found zero.  Neurologist diagnosed with TIA without definitive proof of it.  Called to see PCP; advised that no availability; then called the very next day.  No further L arm heaviness; no further L leg heaviness.  Having some headaches.  When got home from hospital, split Zoloft to 25mg  daily; nagging headaches have not returned.  Did have a L occipital headache to L trapezius region and arm L.  Duration of headache 10-15 minutes and then eases off.  Laying makes it worse.  Sitting up will help it; movement helps more than anything.  Feels like a muscle spasm.  Took Flexeril in past and tolerated.  Previous neck spasms.  Ibuprofen does not touch pain; Ibuprofen 400mg .  Spasm headaches occur at 7:00pm.  No nighttime awakening.  May prevent from falling asleep because cannot get comfortable.    Review of Systems  Constitutional: Negative for fever,  chills, diaphoresis, activity change, appetite change and fatigue.  Eyes: Negative for photophobia and visual disturbance.  Respiratory: Negative for cough and shortness of breath.   Cardiovascular: Negative for chest pain, palpitations and leg swelling.  Gastrointestinal: Negative for nausea, vomiting, abdominal pain and diarrhea.  Endocrine: Negative for cold intolerance, heat intolerance, polydipsia, polyphagia and polyuria.  Musculoskeletal: Positive for neck pain and neck stiffness.  Skin: Negative for color change, rash and wound.  Neurological: Positive for headaches. Negative for dizziness, tremors, seizures, syncope, facial asymmetry, speech difficulty, weakness, light-headedness and numbness.  Psychiatric/Behavioral: Negative for suicidal ideas, sleep disturbance, self-injury and dysphoric  mood. The patient is not nervous/anxious.     Past Medical History  Diagnosis Date  . Depression   . Anxiety   . Allergy   . GERD (gastroesophageal reflux disease)    Past Surgical History  Procedure Laterality Date  . Appendectomy    . Cholecystectomy    . Stress testing  09/16/2012    low risk for ischemia. [place].  Symptoms: chest pain, SOB.  . Polysomnography  08/02/1998    negative for OSA.  No treatment warranted.  . Fracture surgery  11/30/2013    R wrist surgery fracture   Allergies  Allergen Reactions  . Remeron [Mirtazapine] Other (See Comments)    Restless legs  . Omeprazole Rash  . Penicillins Rash    Social History   Social History  . Marital Status: Married    Spouse Name: N/A  . Number of Children: 3  . Years of Education: N/A   Occupational History  . PASTOR     [employer]   Social History Main Topics  . Smoking status: Former Research scientist (life sciences)  . Smokeless tobacco: Not on file  . Alcohol Use: No  . Drug Use: No  . Sexual Activity: No   Other Topics Concern  . Not on file   Social History Narrative   Marital status: married x 40 years.      Children: three children (36, 25, 23); three grandchildren .      Lives: with wife.  [facility] at [facility].        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      Exercise: no formal exercise program; walks to work (250 yards).      Seatbelts:  100%      Guns: none   Family History  Problem Relation Age of Onset  . Anxiety disorder Mother   . Hyperlipidemia Mother   . Stroke Father 60  . COPD Father   . Hypertension Father   . Diabetes Father   . Heart disease Father     heart valve replacement.  . Anxiety disorder Brother   . Mental illness Brother   . Diabetes Brother   . Hypertension Brother   . Hyperlipidemia Brother   . Anxiety disorder Daughter   . Anxiety disorder Maternal Grandmother   . Mental retardation Maternal Grandmother   . Diabetes Paternal Grandfather   . Mental  illness Sister     Anxiety       Objective:    BP 115/68 mmHg  Pulse 64  Temp(Src) 97.8 F (36.6 C) (Oral)  Resp 16  Wt 192 lb (87.091 kg) Physical Exam  Constitutional: He is oriented to person, place, and time. He appears well-developed and well-nourished. No distress.  HENT:  Head: Normocephalic and atraumatic.  Right Ear: External ear normal.  Left Ear: External ear normal.  Nose: Nose normal.  Mouth/Throat: Oropharynx is clear and moist.  Eyes: Conjunctivae and EOM are normal. Pupils are equal, round, and reactive to light.  Neck: Normal range of motion. Neck supple. Carotid bruit is not present. No thyromegaly present.  Cardiovascular: Normal rate, regular rhythm, normal heart sounds and intact distal pulses.  Exam reveals no gallop and no friction rub.   No murmur heard. Pulmonary/Chest: Effort normal and breath sounds normal. He has no wheezes. He has no rales.  Abdominal: Soft. Bowel sounds are normal. He exhibits no distension and no mass. There is no tenderness. There is no rebound and no guarding.  Musculoskeletal:       Right shoulder: Normal. He exhibits normal range of motion and no tenderness.       Left shoulder: Normal. He exhibits normal range of motion and no tenderness.       Cervical back: He exhibits pain and spasm. He exhibits normal range of motion, no tenderness, no bony tenderness and normal pulse.  Lymphadenopathy:    He has no cervical adenopathy.  Neurological: He is alert and oriented to person, place, and time. No cranial nerve deficit.  Skin: Skin is warm and dry. No rash noted. He is not diaphoretic.  Psychiatric: He has a normal mood and affect. His behavior is normal.  Nursing note and vitals reviewed.  Results for orders placed or performed during the hospital encounter of 04/05/15  Ethanol  Result Value Ref Range   Alcohol, Ethyl (B) <5 <5 mg/dL  Protime-INR  Result Value Ref Range   Prothrombin Time 14.4 11.6 - 15.2 seconds   INR  1.10 0.00 - 1.49  APTT  Result Value Ref Range   aPTT 33 24 - 37 seconds  CBC  Result Value Ref Range   WBC 8.7 4.0 - 10.5 K/uL   RBC 4.82 4.22 - 5.81 MIL/uL   Hemoglobin 15.1 13.0 - 17.0 g/dL   HCT 45.3 39.0 - 52.0 %   MCV 94.0 78.0 - 100.0 fL   MCH 31.3 26.0 - 34.0 pg   MCHC 33.3 30.0 - 36.0 g/dL   RDW 13.5 11.5 - 15.5 %   Platelets 276 150 - 400 K/uL  Differential  Result Value Ref Range   Neutrophils Relative % 53 43 - 77 %   Neutro Abs 4.6 1.7 - 7.7 K/uL   Lymphocytes Relative 37 12 - 46 %   Lymphs Abs 3.3 0.7 - 4.0 K/uL   Monocytes Relative 7 3 - 12 %   Monocytes Absolute 0.6 0.1 - 1.0 K/uL   Eosinophils Relative 3 0 - 5 %   Eosinophils Absolute 0.2 0.0 - 0.7 K/uL   Basophils Relative 0 0 - 1 %   Basophils Absolute 0.0 0.0 - 0.1 K/uL  Comprehensive metabolic panel  Result Value Ref Range   Sodium 136 135 - 145 mmol/L   Potassium 4.6 3.5 - 5.1 mmol/L   Chloride 104 101 - 111 mmol/L   CO2 25 22 - 32 mmol/L   Glucose, Bld 104 (H) 65 - 99 mg/dL   BUN 20 6 - 20 mg/dL   Creatinine, Ser 1.09 0.61 - 1.24 mg/dL   Calcium 8.7 (L) 8.9 - 10.3 mg/dL   Total Protein 6.8 6.5 - 8.1 g/dL   Albumin 3.9 3.5 - 5.0 g/dL   AST 23 15 - 41 U/L   ALT 31 17 - 63 U/L   Alkaline Phosphatase 53 38 - 126 U/L   Total Bilirubin 0.5 0.3 - 1.2 mg/dL  GFR calc non Af Amer >60 >60 mL/min   GFR calc Af Amer >60 >60 mL/min   Anion gap 7 5 - 15  Urine rapid drug screen (hosp performed)not at [facility]  Result Value Ref Range   Opiates NONE DETECTED NONE DETECTED   Cocaine NONE DETECTED NONE DETECTED   Benzodiazepines NONE DETECTED NONE DETECTED   Amphetamines NONE DETECTED NONE DETECTED   Tetrahydrocannabinol NONE DETECTED NONE DETECTED   Barbiturates NONE DETECTED NONE DETECTED  Urinalysis, Routine w reflex microscopic (not at [facility])  Result Value Ref Range   Color, Urine YELLOW YELLOW   APPearance CLEAR CLEAR   Specific Gravity, Urine 1.010 1.005 - 1.030   pH 6.5 5.0 - 8.0   Glucose, UA  NEGATIVE NEGATIVE mg/dL   Hgb urine dipstick NEGATIVE NEGATIVE   Bilirubin Urine NEGATIVE NEGATIVE   Ketones, ur NEGATIVE NEGATIVE mg/dL   Protein, ur NEGATIVE NEGATIVE mg/dL   Urobilinogen, UA 0.2 0.0 - 1.0 mg/dL   Nitrite NEGATIVE NEGATIVE   Leukocytes, UA NEGATIVE NEGATIVE  Hemoglobin A1c  Result Value Ref Range   Hgb A1c MFr Bld 6.0 (H) 4.8 - 5.6 %   Mean Plasma Glucose 126 mg/dL  Lipid panel  Result Value Ref Range   Cholesterol 195 0 - 200 mg/dL   Triglycerides 109 <150 mg/dL   HDL 42 >40 mg/dL   Total CHOL/HDL Ratio 4.6 RATIO   VLDL 22 0 - 40 mg/dL   LDL Cholesterol 131 (H) 0 - 99 mg/dL  I-Stat Chem 8, ED  (not at [facility])  Result Value Ref Range   Sodium 140 135 - 145 mmol/L   Potassium 4.3 3.5 - 5.1 mmol/L   Chloride 102 101 - 111 mmol/L   BUN 29 (H) 6 - 20 mg/dL   Creatinine, Ser 1.10 0.61 - 1.24 mg/dL   Glucose, Bld 110 (H) 65 - 99 mg/dL   Calcium, Ion 1.20 1.12 - 1.23 mmol/L   TCO2 28 0 - 100 mmol/L   Hemoglobin 16.0 13.0 - 17.0 g/dL   HCT 47.0 39.0 - 52.0 %  I-Stat Chem 8, ED  (not at [facility])  Result Value Ref Range   Sodium 139 135 - 145 mmol/L   Potassium 4.7 3.5 - 5.1 mmol/L   Chloride 103 101 - 111 mmol/L   BUN 30 (H) 6 - 20 mg/dL   Creatinine, Ser 1.10 0.61 - 1.24 mg/dL   Glucose, Bld 101 (H) 65 - 99 mg/dL   Calcium, Ion 1.10 (L) 1.12 - 1.23 mmol/L   TCO2 24 0 - 100 mmol/L   Hemoglobin 15.6 13.0 - 17.0 g/dL   HCT 46.0 39.0 - 52.0 %  I-stat troponin, ED (not at [facility])  Result Value Ref Range   Troponin i, poc 0.00 0.00 - 0.08 ng/mL   Comment 3               Assessment & Plan:   1. History of TIA (transient ischemic attack)   2. Chronic nonintractable headache, unspecified headache type   3. Neck strain, initial encounter   4. Degenerative disc disease, cervical   5. Pure hypercholesterolemia   6. Glucose intolerance (impaired glucose tolerance)   7. Anxiety and depression     1. History of TIA: New.  S/p admission; hospital  records reviewed in detail during visit.  S/p CT head, MRI brain, CT neck.  S/p carotid dopplers negative; s/p 2D-echo without thrombus. S/p neurology consultation who confirmed TIA; recommended ASA 325mg  daily  and Crestor 10mg  daily.  Normal neurological exam in office; no recurrent symptoms since hospital discharge. 2.  Headaches: New.  Onset one month prior to TIA; combination of tension headaches versus cervical DDD induced headaches and migraines.  Rx for Flexeril 5mg  qhs provided to treat muscle spasm contributing to headaches.   3.  Neck pain/strain/DDD cervical spine: New.  S/p CT cervical spine during admission that revealed degenerative changes; possible etiology to headaches and neck pain; treat with Flexeril 5mg  qhs scheduled for two weeks and then PRN. If persists, consider referral to ortho and/or PT. 4.  Hypercholesterolemia: uncontrolled; pt refuses statin; failed trial of Zocor, Lipitor, Crestor in the past; not willing to try Pravastatin or Lovastatin at this time.  Desires three month trial of weight loss and low cholesterol food choices. 5.  Glucose intolerance: recommend weight loss, exercise, low-sugar and low-carbohydrate food choices. 6.  Anxiety and depression: stable currently on Zoloft 25mg  daily; monitor mood closely.  Does not appear that acute anxiety was etiology to TIA symptoms.  4.   No orders of the defined types were placed in this encounter.   Meds ordered this encounter  Medications  . cyclobenzaprine (FLEXERIL) 5 MG tablet    Sig: Take 1 tablet (5 mg total) by mouth at bedtime.    Dispense:  30 tablet    Refill:  1    Return in about 3 months (around 07/14/2015) for recheck cholesterol, headaches, anxiety/depression.Norwood Levo, M.D. [facility] [address] [place], [place]  [address] [phone] phone [phone] fax

## 2015-04-14 NOTE — Patient Instructions (Signed)
1. Take Flexeril 5mg  one every evening for two weeks scheduled and then as needed.  High Cholesterol High cholesterol refers to having a high level of cholesterol in your blood. Cholesterol is a white, waxy, fat-like protein that your body needs in small amounts. Your liver makes all the cholesterol you need. Excess cholesterol comes from the food you eat. Cholesterol travels in your bloodstream through your blood vessels. If you have high cholesterol, deposits (plaque) may build up on the walls of your blood vessels. This makes the arteries narrower and stiffer. Plaque increases your risk of heart attack and stroke. Work with your health care provider to keep your cholesterol levels in a healthy range. RISK FACTORS Several things can make you more likely to have high cholesterol. These include:   Eating foods high in animal fat (saturated fat) or cholesterol.  Being overweight.  Not getting enough exercise.  Having a family history of high cholesterol. SIGNS AND SYMPTOMS High cholesterol does not cause symptoms. DIAGNOSIS  Your health care provider can do a blood test to check whether you have high cholesterol. If you are older than 20, your health care provider may check your cholesterol every 4-6 years. You may be checked more often if you already have high cholesterol or other risk factors for heart disease. The blood test for cholesterol measures the following:  Bad cholesterol (LDL cholesterol). This is the type of cholesterol that causes heart disease. This number should be less than 100.  Good cholesterol (HDL cholesterol). This type helps protect against heart disease. A healthy level of HDL cholesterol is 60 or higher.  Total cholesterol. This is the combined number of LDL cholesterol and HDL cholesterol. A healthy number is less than 200. TREATMENT  High cholesterol can be treated with diet changes, lifestyle changes, and medicine.   Diet changes may include eating more whole  grains, fruits, vegetables, nuts, and fish. You may also have to cut back on red meat and foods with a lot of added sugar.  Lifestyle changes may include getting at least 40 minutes of aerobic exercise three times a week. Aerobic exercises include walking, biking, and swimming. Aerobic exercise along with a healthy diet can help you maintain a healthy weight. Lifestyle changes may also include quitting smoking.  If diet and lifestyle changes are not enough to lower your cholesterol, your health care provider may prescribe a statin medicine. This medicine has been shown to lower cholesterol and also lower the risk of heart disease. HOME CARE INSTRUCTIONS  Only take over-the-counter or prescription medicines as directed by your health care provider.   Follow a healthy diet as directed by your health care provider. For instance:   Eat chicken (without skin), fish, veal, shellfish, ground [place] breast, and round or loin cuts of red meat.  Do not eat fried foods and fatty meats, such as hot dogs and salami.   Eat plenty of fruits, such as apples.   Eat plenty of vegetables, such as broccoli, potatoes, and carrots.   Eat beans, peas, and lentils.   Eat grains, such as barley, rice, couscous, and bulgur wheat.   Eat pasta without cream sauces.   Use skim or nonfat milk and low-fat or nonfat yogurt and cheeses. Do not eat or drink whole milk, cream, ice cream, egg yolks, and hard cheeses.   Do not eat stick margarine or tub margarines that contain trans fats (also called partially hydrogenated oils).   Do not eat cakes, cookies, crackers, or other  baked goods that contain trans fats.   Do not eat saturated tropical oils, such as coconut and palm oil.   Exercise as directed by your health care provider. Increase your activity level with activities such as gardening or walking.   Keep all follow-up appointments.  SEEK MEDICAL CARE IF:  You are struggling to maintain a  healthy diet or weight.  You need help starting an exercise program.  You need help to stop smoking. SEEK IMMEDIATE MEDICAL CARE IF:  You have chest pain.  You have trouble breathing. Document Released: 07/19/2005 Document Revised: 12/03/2013 Document Reviewed: 05/11/2013 [facility] Patient Information 2015 [place], [place]. This information is not intended to replace advice given to you by your health care provider. Make sure you discuss any questions you have with your health care provider.

## 2015-06-24 ENCOUNTER — Other Ambulatory Visit: Payer: Self-pay | Admitting: Family Medicine

## 2015-06-24 ENCOUNTER — Encounter: Payer: Self-pay | Admitting: Family Medicine

## 2015-07-14 ENCOUNTER — Ambulatory Visit: Payer: BLUE CROSS/BLUE SHIELD | Admitting: Family Medicine

## 2015-09-17 ENCOUNTER — Other Ambulatory Visit: Payer: Self-pay | Admitting: Family Medicine

## 2015-09-17 ENCOUNTER — Encounter: Payer: Self-pay | Admitting: Family Medicine

## 2015-09-19 NOTE — Telephone Encounter (Signed)
See also "refill" req from pharm for zoloft.

## 2015-09-19 NOTE — Telephone Encounter (Signed)
Dr Tamala Julian, please see pt's email concerning changed dosing of zoloft. Don't know if you want to change this Rx or not?

## 2015-09-29 ENCOUNTER — Other Ambulatory Visit: Payer: Self-pay | Admitting: Family Medicine

## 2015-09-29 ENCOUNTER — Ambulatory Visit (INDEPENDENT_AMBULATORY_CARE_PROVIDER_SITE_OTHER): Payer: BLUE CROSS/BLUE SHIELD | Admitting: Family Medicine

## 2015-09-29 ENCOUNTER — Encounter: Payer: Self-pay | Admitting: Family Medicine

## 2015-09-29 VITALS — BP 108/68 | HR 64 | Temp 98.1°F | Resp 16 | Ht 72.0 in | Wt 189.0 lb

## 2015-09-29 DIAGNOSIS — R7302 Impaired glucose tolerance (oral): Secondary | ICD-10-CM | POA: Diagnosis not present

## 2015-09-29 DIAGNOSIS — E7800 Pure hypercholesterolemia, unspecified: Secondary | ICD-10-CM | POA: Diagnosis not present

## 2015-09-29 DIAGNOSIS — G459 Transient cerebral ischemic attack, unspecified: Secondary | ICD-10-CM | POA: Diagnosis not present

## 2015-09-29 DIAGNOSIS — F419 Anxiety disorder, unspecified: Secondary | ICD-10-CM

## 2015-09-29 LAB — CBC WITH DIFFERENTIAL/PLATELET
BASOS PCT: 0 % (ref 0–1)
Basophils Absolute: 0 10*3/uL (ref 0.0–0.1)
EOS ABS: 0.2 10*3/uL (ref 0.0–0.7)
Eosinophils Relative: 2 % (ref 0–5)
HCT: 49.6 % (ref 39.0–52.0)
Hemoglobin: 17.3 g/dL — ABNORMAL HIGH (ref 13.0–17.0)
Lymphocytes Relative: 36 % (ref 12–46)
Lymphs Abs: 2.7 10*3/uL (ref 0.7–4.0)
MCH: 32.3 pg (ref 26.0–34.0)
MCHC: 34.9 g/dL (ref 30.0–36.0)
MCV: 92.7 fL (ref 78.0–100.0)
MONOS PCT: 9 % (ref 3–12)
MPV: 9.2 fL (ref 8.6–12.4)
Monocytes Absolute: 0.7 10*3/uL (ref 0.1–1.0)
NEUTROS PCT: 53 % (ref 43–77)
Neutro Abs: 4 10*3/uL (ref 1.7–7.7)
PLATELETS: 300 10*3/uL (ref 150–400)
RBC: 5.35 MIL/uL (ref 4.22–5.81)
RDW: 14.1 % (ref 11.5–15.5)
WBC: 7.5 10*3/uL (ref 4.0–10.5)

## 2015-09-29 LAB — COMPREHENSIVE METABOLIC PANEL
ALT: 34 U/L (ref 9–46)
AST: 24 U/L (ref 10–35)
Albumin: 4.5 g/dL (ref 3.6–5.1)
Alkaline Phosphatase: 60 U/L (ref 40–115)
BUN: 18 mg/dL (ref 7–25)
CHLORIDE: 102 mmol/L (ref 98–110)
CO2: 28 mmol/L (ref 20–31)
CREATININE: 1.13 mg/dL (ref 0.70–1.25)
Calcium: 9.9 mg/dL (ref 8.6–10.3)
Glucose, Bld: 114 mg/dL — ABNORMAL HIGH (ref 65–99)
Potassium: 5.5 mmol/L — ABNORMAL HIGH (ref 3.5–5.3)
SODIUM: 140 mmol/L (ref 135–146)
TOTAL PROTEIN: 7.3 g/dL (ref 6.1–8.1)
Total Bilirubin: 0.7 mg/dL (ref 0.2–1.2)

## 2015-09-29 LAB — LIPID PANEL
CHOL/HDL RATIO: 4.5 ratio (ref <=5.0)
Cholesterol: 237 mg/dL — ABNORMAL HIGH (ref 125–200)
HDL: 53 mg/dL (ref >=40)
LDL CALC: 166 mg/dL — AB (ref <130)
Triglycerides: 91 mg/dL (ref <150)
VLDL: 18 mg/dL (ref <30)

## 2015-09-29 MED ORDER — BUPROPION HCL ER (XL) 150 MG PO TB24: 150.0000 mg | ORAL_TABLET | Freq: Every day | ORAL | Status: DC

## 2015-09-29 MED ORDER — CLONAZEPAM 0.5 MG PO TABS: ORAL_TABLET | ORAL | Status: DC

## 2015-09-29 NOTE — Patient Instructions (Signed)
1. Exercise every day for 30 minutes. 2.  Take a Klonopin every morning for two weeks. 3. Split Zoloft one tablet twice daily. 4.  Take Wellbutrin every morning.

## 2015-09-29 NOTE — Progress Notes (Signed)
Subjective:    Patient ID: Brian Houston, male    DOB: 1956-07-28, 60 y.o.   MRN: [id number]  09/29/2015  Follow-up and Depression   HPI This 60 y.o. male presents for six month follow-up:  1. Anxiety and depression: started worsening in October 2016; overwhelmed; indifferent; no pleasure.  No stressors. No trigger.  Was taking Zoloft 50mg  daily in October 2016.  Increased to Zoloft 100mg  daily three weeks ago.  Feels well this morning which is unusual. Insomnia; not sleeping well.  Has increased Klonopin four days per week.  Taking Flexeril qhs at 9:00pm; wakes up 7:30-9:00am.  No SI/HI.  One month ago, stayed in bed 24 hours; has never done that; due to depression.  Over analyze things.  Dreads the next day.  Tired of people who act like children.  Major stressors at church.  Considering a job change; no debt.  No exercise.  Does woodworking as an Development worker, community; finds pleasure in [place] in past.  Has considered counseling in the past yet insurance did not cover; cannot afford cash. Could afford copay.  Must work until age 66.  No retirement plan.  Will depend on social security.   Has tried: Prozac, Paxil, Celexa, now Zoloft.  Has not Lexapro. Might have tried Cymbalta.  No Wellbutrin. Taking Klonopin prior to event.   2. Hypercholesterolemia: refuses to take statin therapy despite TIA.  3.  Glucose intolerance: due for repeat labs.  4.  TIA hx: taking ASA; no recurrent issues since last visit and admission.  Not exercising; not taking statin therapy.   Review of Systems  Constitutional: Negative for fever, chills, diaphoresis, activity change, appetite change and fatigue.  Respiratory: Negative for cough and shortness of breath.   Cardiovascular: Negative for chest pain, palpitations and leg swelling.  Gastrointestinal: Negative for nausea, vomiting, abdominal pain and diarrhea.  Endocrine: Negative for cold intolerance, heat intolerance, polydipsia, polyphagia and polyuria.  Skin:  Negative for color change, rash and wound.  Neurological: Negative for dizziness, tremors, seizures, syncope, facial asymmetry, speech difficulty, weakness, light-headedness, numbness and headaches.  Psychiatric/Behavioral: Positive for sleep disturbance and dysphoric mood. Negative for suicidal ideas and self-injury. The patient is nervous/anxious.     Past Medical History  Diagnosis Date  . Depression   . Anxiety   . Allergy   . GERD (gastroesophageal reflux disease)    Past Surgical History  Procedure Laterality Date  . Appendectomy    . Cholecystectomy    . Stress testing  09/16/2012    low risk for ischemia. [place].  Symptoms: chest pain, SOB.  . Polysomnography  08/02/1998    negative for OSA.  No treatment warranted.  . Fracture surgery  11/30/2013    R wrist surgery fracture   Allergies  Allergen Reactions  . Remeron [Mirtazapine] Other (See Comments)    Restless legs  . Omeprazole Rash  . Penicillins Rash    Social History   Social History  . Marital Status: Married    Spouse Name: N/A  . Number of Children: 3  . Years of Education: N/A   Occupational History  . PASTOR     [employer]   Social History Main Topics  . Smoking status: Former Research scientist (life sciences)  . Smokeless tobacco: Not on file  . Alcohol Use: No  . Drug Use: No  . Sexual Activity: No   Other Topics Concern  . Not on file   Social History Narrative   Marital status: married x 40 years.  Children: three children (36, 25, 23); three grandchildren .      Lives: with wife.  [facility] at [facility]        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      Exercise: no formal exercise program; walks to work (250 yards).      Seatbelts:  100%      Guns: none   Family History  Problem Relation Age of Onset  . Anxiety disorder Mother   . Hyperlipidemia Mother   . Stroke Father 49  . COPD Father   . Hypertension Father   . Diabetes Father   . Heart disease Father     heart valve  replacement.  . Anxiety disorder Brother   . Mental illness Brother   . Diabetes Brother   . Hypertension Brother   . Hyperlipidemia Brother   . Anxiety disorder Daughter   . Anxiety disorder Maternal Grandmother   . Mental retardation Maternal Grandmother   . Diabetes Paternal Grandfather   . Mental illness Sister     Anxiety       Objective:    BP 108/68 mmHg  Pulse 64  Temp(Src) 98.1 F (36.7 C)  Resp 16  Ht 6' (1.829 m)  Wt 189 lb (85.73 kg)  BMI 25.63 kg/m2  SpO2 98% Physical Exam  Constitutional: He is oriented to person, place, and time. He appears well-developed and well-nourished. No distress.  HENT:  Head: Normocephalic and atraumatic.  Right Ear: External ear normal.  Left Ear: External ear normal.  Nose: Nose normal.  Mouth/Throat: Oropharynx is clear and moist.  Eyes: Conjunctivae and EOM are normal. Pupils are equal, round, and reactive to light.  Neck: Normal range of motion. Neck supple. Carotid bruit is not present. No thyromegaly present.  Cardiovascular: Normal rate, regular rhythm, normal heart sounds and intact distal pulses.  Exam reveals no gallop and no friction rub.   No murmur heard. Pulmonary/Chest: Effort normal and breath sounds normal. He has no wheezes. He has no rales.  Abdominal: Soft. Bowel sounds are normal. He exhibits no distension and no mass. There is no tenderness. There is no rebound and no guarding.  Lymphadenopathy:    He has no cervical adenopathy.  Neurological: He is alert and oriented to person, place, and time. No cranial nerve deficit.  Skin: Skin is warm and dry. No rash noted. He is not diaphoretic.  Psychiatric: He has a normal mood and affect. His behavior is normal.  Nursing note and vitals reviewed.  Results for orders placed or performed during the hospital encounter of 04/05/15  Ethanol  Result Value Ref Range   Alcohol, Ethyl (B) <5 <5 mg/dL  Protime-INR  Result Value Ref Range   Prothrombin Time 14.4 11.6  - 15.2 seconds   INR 1.10 0.00 - 1.49  APTT  Result Value Ref Range   aPTT 33 24 - 37 seconds  CBC  Result Value Ref Range   WBC 8.7 4.0 - 10.5 K/uL   RBC 4.82 4.22 - 5.81 MIL/uL   Hemoglobin 15.1 13.0 - 17.0 g/dL   HCT 45.3 39.0 - 52.0 %   MCV 94.0 78.0 - 100.0 fL   MCH 31.3 26.0 - 34.0 pg   MCHC 33.3 30.0 - 36.0 g/dL   RDW 13.5 11.5 - 15.5 %   Platelets 276 150 - 400 K/uL  Differential  Result Value Ref Range   Neutrophils Relative % 53 43 - 77 %   Neutro Abs  4.6 1.7 - 7.7 K/uL   Lymphocytes Relative 37 12 - 46 %   Lymphs Abs 3.3 0.7 - 4.0 K/uL   Monocytes Relative 7 3 - 12 %   Monocytes Absolute 0.6 0.1 - 1.0 K/uL   Eosinophils Relative 3 0 - 5 %   Eosinophils Absolute 0.2 0.0 - 0.7 K/uL   Basophils Relative 0 0 - 1 %   Basophils Absolute 0.0 0.0 - 0.1 K/uL  Comprehensive metabolic panel  Result Value Ref Range   Sodium 136 135 - 145 mmol/L   Potassium 4.6 3.5 - 5.1 mmol/L   Chloride 104 101 - 111 mmol/L   CO2 25 22 - 32 mmol/L   Glucose, Bld 104 (H) 65 - 99 mg/dL   BUN 20 6 - 20 mg/dL   Creatinine, Ser 1.09 0.61 - 1.24 mg/dL   Calcium 8.7 (L) 8.9 - 10.3 mg/dL   Total Protein 6.8 6.5 - 8.1 g/dL   Albumin 3.9 3.5 - 5.0 g/dL   AST 23 15 - 41 U/L   ALT 31 17 - 63 U/L   Alkaline Phosphatase 53 38 - 126 U/L   Total Bilirubin 0.5 0.3 - 1.2 mg/dL   GFR calc non Af Amer >60 >60 mL/min   GFR calc Af Amer >60 >60 mL/min   Anion gap 7 5 - 15  Urine rapid drug screen (hosp performed)not at [facility]  Result Value Ref Range   Opiates NONE DETECTED NONE DETECTED   Cocaine NONE DETECTED NONE DETECTED   Benzodiazepines NONE DETECTED NONE DETECTED   Amphetamines NONE DETECTED NONE DETECTED   Tetrahydrocannabinol NONE DETECTED NONE DETECTED   Barbiturates NONE DETECTED NONE DETECTED  Urinalysis, Routine w reflex microscopic (not at [facility])  Result Value Ref Range   Color, Urine YELLOW YELLOW   APPearance CLEAR CLEAR   Specific Gravity, Urine 1.010 1.005 - 1.030   pH 6.5 5.0 -  8.0   Glucose, UA NEGATIVE NEGATIVE mg/dL   Hgb urine dipstick NEGATIVE NEGATIVE   Bilirubin Urine NEGATIVE NEGATIVE   Ketones, ur NEGATIVE NEGATIVE mg/dL   Protein, ur NEGATIVE NEGATIVE mg/dL   Urobilinogen, UA 0.2 0.0 - 1.0 mg/dL   Nitrite NEGATIVE NEGATIVE   Leukocytes, UA NEGATIVE NEGATIVE  Hemoglobin A1c  Result Value Ref Range   Hgb A1c MFr Bld 6.0 (H) 4.8 - 5.6 %   Mean Plasma Glucose 126 mg/dL  Lipid panel  Result Value Ref Range   Cholesterol 195 0 - 200 mg/dL   Triglycerides 109 <150 mg/dL   HDL 42 >40 mg/dL   Total CHOL/HDL Ratio 4.6 RATIO   VLDL 22 0 - 40 mg/dL   LDL Cholesterol 131 (H) 0 - 99 mg/dL  I-Stat Chem 8, ED  (not at [facility], [facility])  Result Value Ref Range   Sodium 140 135 - 145 mmol/L   Potassium 4.3 3.5 - 5.1 mmol/L   Chloride 102 101 - 111 mmol/L   BUN 29 (H) 6 - 20 mg/dL   Creatinine, Ser 1.10 0.61 - 1.24 mg/dL   Glucose, Bld 110 (H) 65 - 99 mg/dL   Calcium, Ion 1.20 1.12 - 1.23 mmol/L   TCO2 28 0 - 100 mmol/L   Hemoglobin 16.0 13.0 - 17.0 g/dL   HCT 47.0 39.0 - 52.0 %  I-Stat Chem 8, ED  (not at [facility])  Result Value Ref Range   Sodium 139 135 - 145 mmol/L   Potassium 4.7 3.5 - 5.1 mmol/L   Chloride 103 101 -  111 mmol/L   BUN 30 (H) 6 - 20 mg/dL   Creatinine, Ser 1.10 0.61 - 1.24 mg/dL   Glucose, Bld 101 (H) 65 - 99 mg/dL   Calcium, Ion 1.10 (L) 1.12 - 1.23 mmol/L   TCO2 24 0 - 100 mmol/L   Hemoglobin 15.6 13.0 - 17.0 g/dL   HCT 46.0 39.0 - 52.0 %  I-stat troponin, ED (not at [facility], [facility])  Result Value Ref Range   Troponin i, poc 0.00 0.00 - 0.08 ng/mL   Comment 3               Assessment & Plan:   1. Pure hypercholesterolemia   2. Anxiety   3. Transient cerebral ischemia, unspecified transient cerebral ischemia type   4. Glucose intolerance (impaired glucose tolerance)     1. Anxiety and depression: worsening; add Wellubtrin XL 150mg  daily; continue Zoloft 50mg  but split bid; start Klonopin 0.5mg  once daily.  Refer for counseling.   RTC 4-6 weeks.  Start daily exercise; obtain Vitamin D and B12 levels. 2.  Hypercholesterolemia; uncontrolled; obtain labs.  Refuses statin. 3.  TIA hx: stable; continue ASA therapy. 4.  Glucose intolerance: stable; obtain labs; continue dietary  Modification.     Orders Placed This Encounter  Procedures  . CBC with Differential/Platelet  . Comprehensive metabolic panel    Order Specific Question:  Has the patient fasted?    Answer:  Yes  . Hemoglobin A1c  . Lipid panel    Order Specific Question:  Has the patient fasted?    Answer:  Yes  . VITAMIN D 25 Hydroxy (Vit-D Deficiency, Fractures)  . Vitamin B12  . Ambulatory referral to Psychology    Referral Priority:  Routine    Referral Type:  Psychiatric    Referral Reason:  Specialty Services Required    Requested Specialty:  Psychology    Number of Visits Requested:  1   Meds ordered this encounter  Medications  . buPROPion (WELLBUTRIN XL) 150 MG 24 hr tablet    Sig: Take 1 tablet (150 mg total) by mouth daily.    Dispense:  30 tablet    Refill:  5  . clonazePAM (KLONOPIN) 0.5 MG tablet    Sig: Take 1/2-1 as needed once daily for anxiety    Dispense:  30 tablet    Refill:  5    Return in about 4 weeks (around 10/27/2015) for recheck anxiety/depression.    Aadhav Uhlig Elayne Guerin, M.D. [facility] [address] [place], [place]  [address] [phone] phone [phone] fax

## 2015-09-30 ENCOUNTER — Encounter: Payer: Self-pay | Admitting: Family Medicine

## 2015-09-30 LAB — VITAMIN B12: VITAMIN B 12: 466 pg/mL (ref 200–1100)

## 2015-09-30 LAB — HEMOGLOBIN A1C
Hgb A1c MFr Bld: 5.9 % — ABNORMAL HIGH (ref <5.7)
Mean Plasma Glucose: 123 mg/dL — ABNORMAL HIGH (ref <117)

## 2015-09-30 LAB — VITAMIN D 25 HYDROXY (VIT D DEFICIENCY, FRACTURES): Vit D, 25-Hydroxy: 26 ng/mL — ABNORMAL LOW (ref 30–100)

## 2015-10-15 ENCOUNTER — Encounter: Payer: Self-pay | Admitting: Family Medicine

## 2015-10-16 MED ORDER — PRAVASTATIN SODIUM 20 MG PO TABS: 20.0000 mg | ORAL_TABLET | Freq: Every day | ORAL | Status: DC

## 2015-10-18 MED ORDER — ESCITALOPRAM OXALATE 10 MG PO TABS: 10.0000 mg | ORAL_TABLET | Freq: Every day | ORAL | Status: DC

## 2015-10-18 NOTE — Addendum Note (Signed)
Addended by: [name] on: 10/18/2015 06:00 PM   Modules accepted: Orders

## 2016-03-23 ENCOUNTER — Ambulatory Visit (INDEPENDENT_AMBULATORY_CARE_PROVIDER_SITE_OTHER): Payer: BLUE CROSS/BLUE SHIELD | Admitting: Family Medicine

## 2016-03-23 ENCOUNTER — Encounter: Payer: Self-pay | Admitting: Family Medicine

## 2016-03-23 VITALS — BP 122/72 | HR 66 | Temp 97.6°F | Resp 17 | Ht 71.5 in | Wt 193.0 lb

## 2016-03-23 DIAGNOSIS — J209 Acute bronchitis, unspecified: Secondary | ICD-10-CM

## 2016-03-23 DIAGNOSIS — J989 Respiratory disorder, unspecified: Secondary | ICD-10-CM

## 2016-03-23 DIAGNOSIS — T7840XA Allergy, unspecified, initial encounter: Secondary | ICD-10-CM | POA: Diagnosis not present

## 2016-03-23 MED ORDER — AZITHROMYCIN 250 MG PO TABS: ORAL_TABLET | ORAL | 0 refills | Status: DC

## 2016-03-23 MED ORDER — HYDROCODONE-HOMATROPINE 5-1.5 MG/5ML PO SYRP: 5.0000 mL | ORAL_SOLUTION | ORAL | 0 refills | Status: DC | PRN

## 2016-03-23 MED ORDER — BENZONATATE 100 MG PO CAPS: 100.0000 mg | ORAL_CAPSULE | Freq: Three times a day (TID) | ORAL | 0 refills | Status: DC | PRN

## 2016-03-23 NOTE — Patient Instructions (Addendum)
Take azithromycin 2 pills initially then 1 daily for 4 days for antibiotic  Take the benzonatate cough pills one or 2 pills 3 times daily as needed for cough. This tends to be nonsedating and you can take it when you're trying to work.  Take the Hycodan cough syrup 1 teaspoon every 4-6 hours as needed for nighttime cough  Continue to drink plenty of fluids and get enough rest  Return if worse  If the low-grade chronic cough persists, you might try just taking an allergy pill such as Zyrtec one every night and see how that does. If despite that you keep on with the nighttime cough come back for some further assessment.    IF you received an x-ray today, you will receive an invoice from [facility]. Please contact [facility] at [phone] with questions or concerns regarding your invoice.   IF you received labwork today, you will receive an invoice from [employer]. Please contact [facility] at [phone] with questions or concerns regarding your invoice.   Our billing staff will not be able to assist you with questions regarding bills from these companies.  You will be contacted with the lab results as soon as they are available. The fastest way to get your results is to activate your My Chart account. Instructions are located on the last page of this paperwork. If you have not heard from [place] regarding the results in 2 weeks, please contact this office.

## 2016-03-23 NOTE — Progress Notes (Signed)
Patient ID: Brian Houston, male    DOB: [birth year]  Age: 60 y.o. MRN: [id number]  Chief Complaint  Patient presents with  . Cough  . 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 He mowed his lawn a week ago and has been coughing since then. He is coughing up purulent phlegm. He is staying congested. He was in the bed Saturday not feeling good and thought it might have been feverish then. He does not smoke. He has had a low-grade chronic nighttime cough that he thinks may be allergy.    Current allergies, medications, problem list, past/family and social histories reviewed.  Objective:  BP 122/72 (BP Location: Right Arm, Patient Position: Sitting, Cuff Size: Normal)   Pulse 66   Temp 97.6 F (36.4 C) (Oral)   Resp 17   Ht 5' 11.5" (1.816 m)   Wt 193 lb (87.5 kg)   SpO2 96%   BMI 26.54 kg/m   No acute distress. TMs normal. Throat clear. Neck supple without nodes. Chest is mildly congested with scattered rhonchi. No rales. Heart regular without murmur.  Assessment & Plan:   Assessment: 1. Acute bronchitis, unspecified organism   2. Allergic disorder of respiratory system, initial encounter       Plan: Treat for a bronchitis which was probably started by his allergies. See instructions.  No orders of the defined types were placed in this encounter.   Meds ordered this encounter  Medications  . benzonatate (TESSALON) 100 MG capsule    Sig: Take 1-2 capsules (100-200 mg total) by mouth 3 (three) times daily as needed.    Dispense:  30 capsule    Refill:  0  . HYDROcodone-homatropine (HYCODAN) 5-1.5 MG/5ML syrup    Sig: Take 5 mLs by mouth every 4 (four) hours as needed.    Dispense:  120 mL    Refill:  0  . azithromycin (ZITHROMAX) 250 MG tablet    Sig: Take 2 pills initially, then one daily for 4 days.    Dispense:  6 tablet    Refill:  0         Patient Instructions   Take azithromycin 2 pills initially then 1  daily for 4 days for antibiotic  Take the benzonatate cough pills one or 2 pills 3 times daily as needed for cough. This tends to be nonsedating and you can take it when you're trying to work.  Take the Hycodan cough syrup 1 teaspoon every 4-6 hours as needed for nighttime cough  Continue to drink plenty of fluids and get enough rest  Return if worse  If the low-grade chronic cough persists, you might try just taking an allergy pill such as Zyrtec one every night and see how that does. If despite that you keep on with the nighttime cough come back for some further assessment.    IF you received an x-ray today, you will receive an invoice from [facility]. Please contact [facility] at [phone] with questions or concerns regarding your invoice.   IF you received labwork today, you will receive an invoice from [employer]. Please contact [facility] at [phone] with questions or concerns regarding your invoice.   Our billing staff will not be able to assist you with questions regarding bills from these companies.  You will be contacted with the lab results as soon as they are available. The fastest way to get your results is to activate  your My Chart account. Instructions are located on the last page of this paperwork. If you have not heard from [place] regarding the results in 2 weeks, please contact this office.         No Follow-up on file.   HOPPER,DAVID, MD 03/23/2016

## 2016-04-05 ENCOUNTER — Other Ambulatory Visit: Payer: Self-pay | Admitting: Family Medicine

## 2016-05-14 ENCOUNTER — Encounter: Payer: Self-pay | Admitting: Family Medicine

## 2016-06-10 IMAGING — MR MR MRA HEAD W/O CM
2 series · 20 of 48 positions shown · non-contrast
Comparison: MRI head 04/05/2015

CLINICAL DATA: TIA.  Left-sided weakness

EXAM:
MRA HEAD WITHOUT CONTRAST
TECHNIQUE: Angiographic images of the Circle of [place] were obtained using MRA
technique without intravenous contrast.

[Series 3: (id) mt fs · axial · 1.4mm · 0.43mm/px · z∈[-101,+1]mm · 19 of 154 slices shown]
[im 1/154]
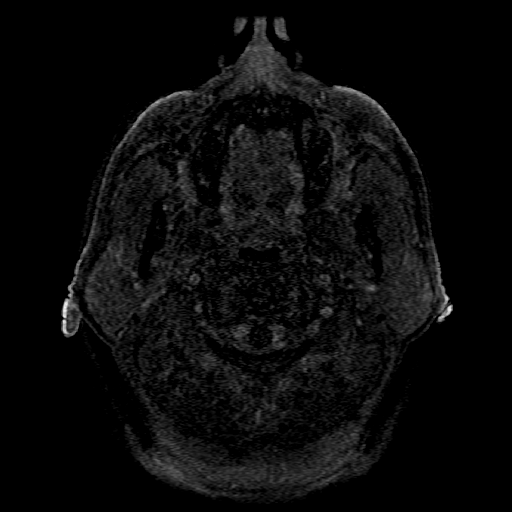
[im 4/154]
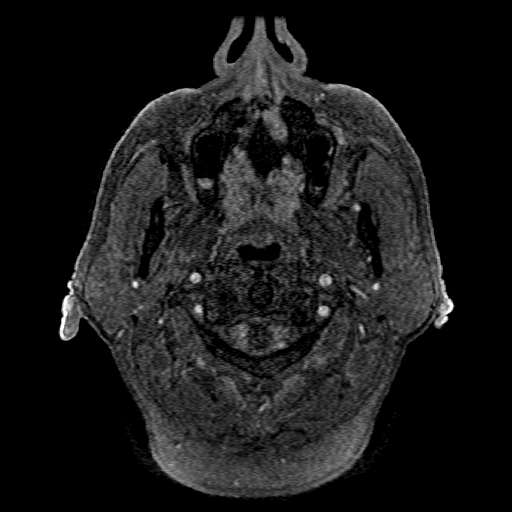
[im 7/154]
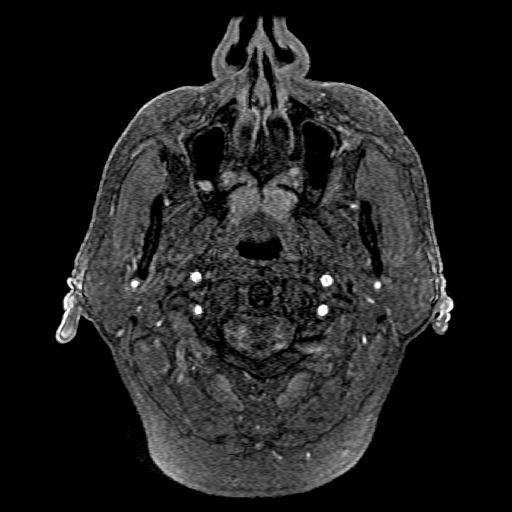
[im 10/154]
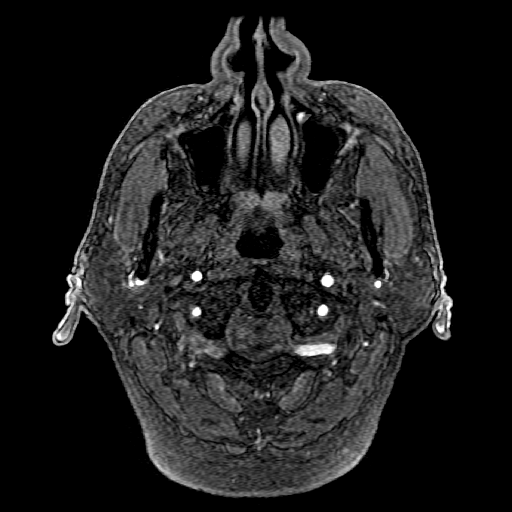
[im 14/154]
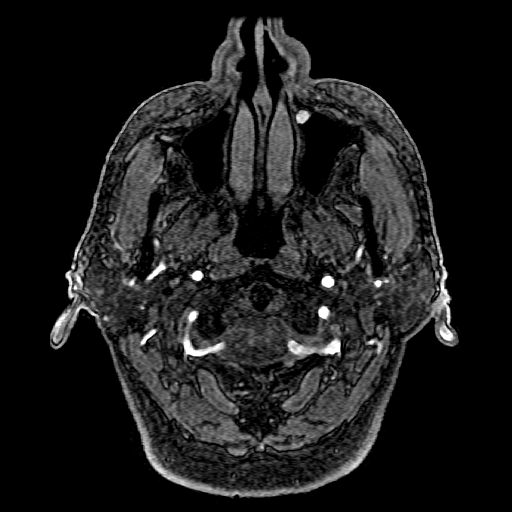
[im 17/154]
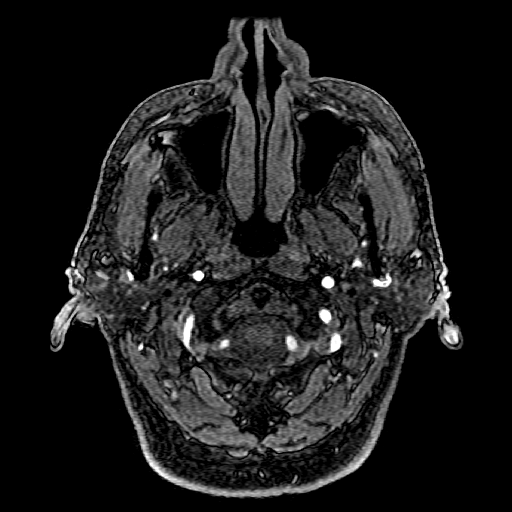
[im 20/154]
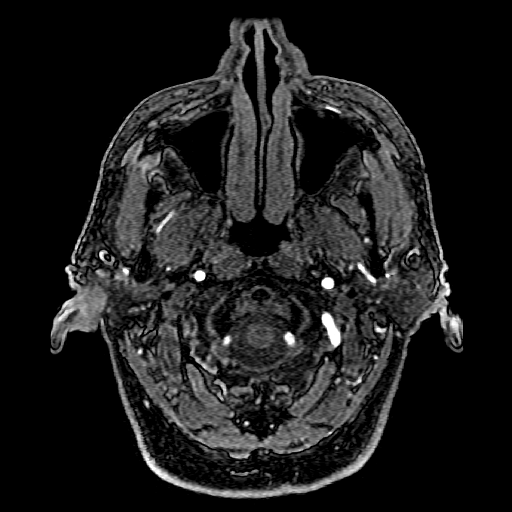
[im 24/154]
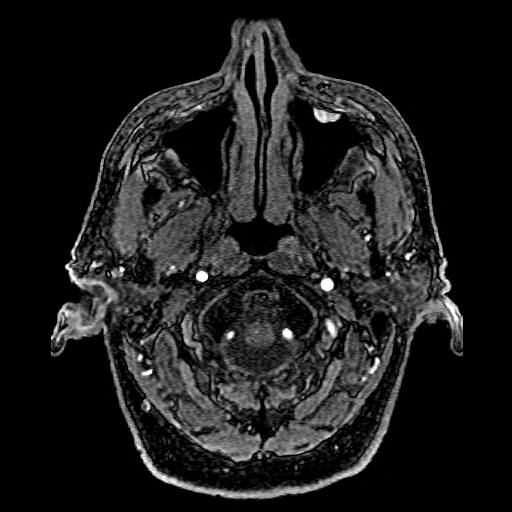
[im 27/154]
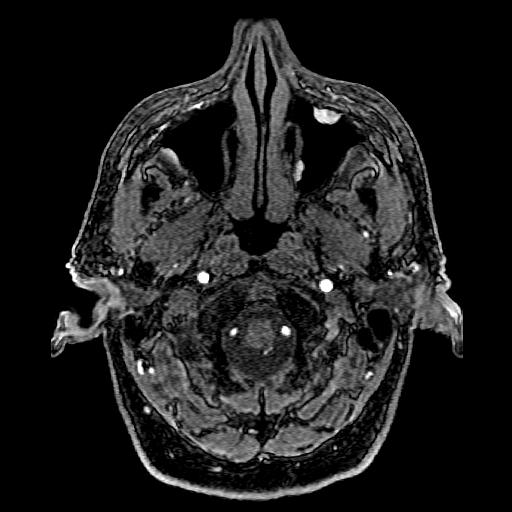
[im 30/154]
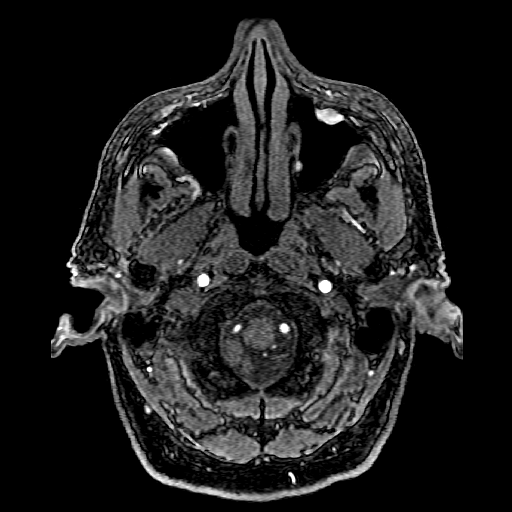
[im 34/154]
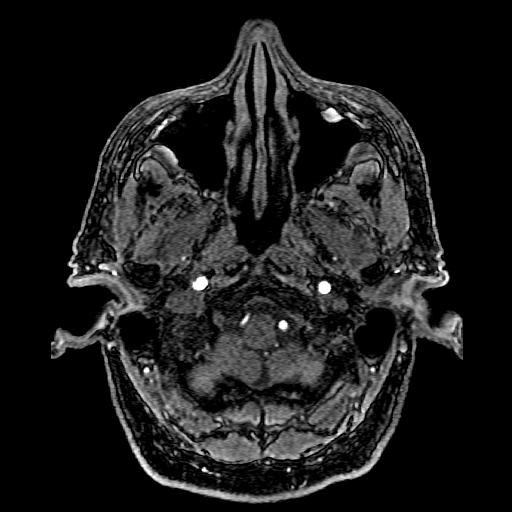
[im 47/154]
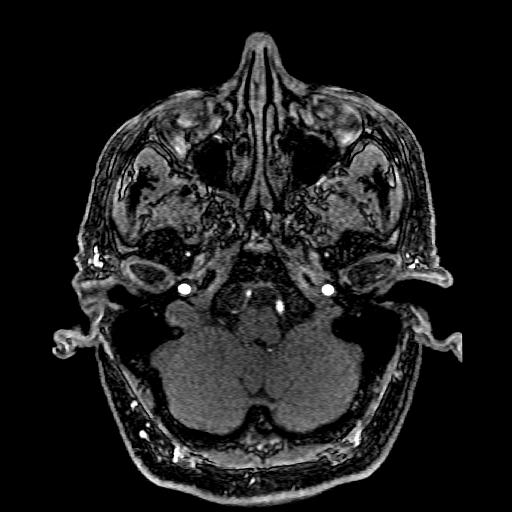
[im 67/154]
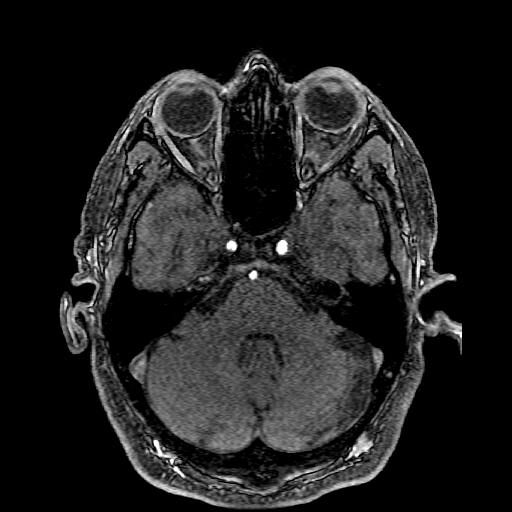
[im 77/154]
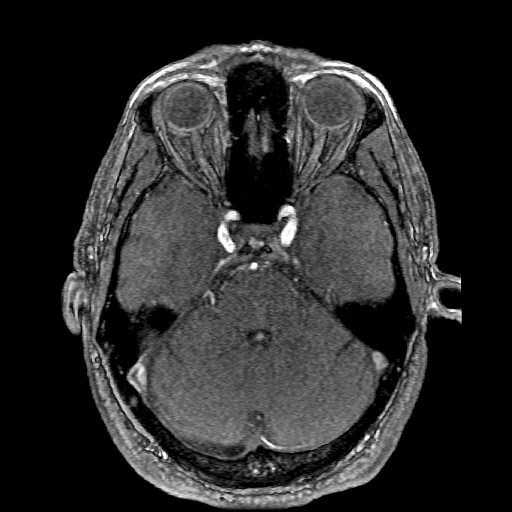
[im 87/154]
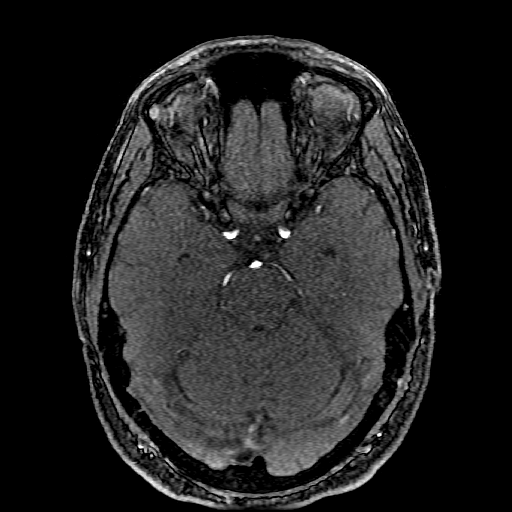
[im 107/154]
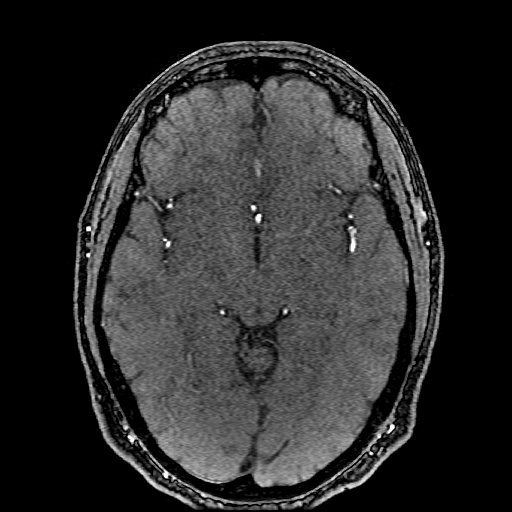
[im 127/154]
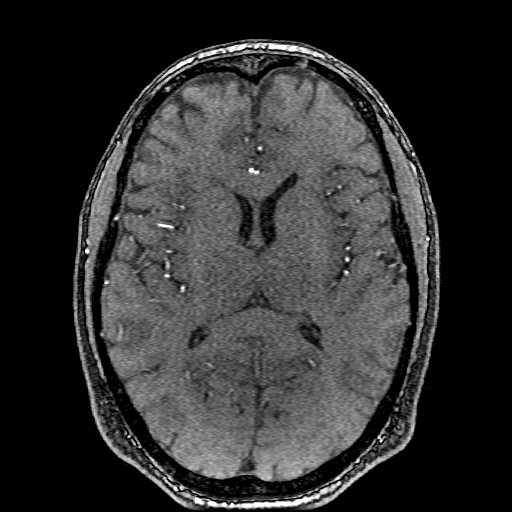
[im 130/154]
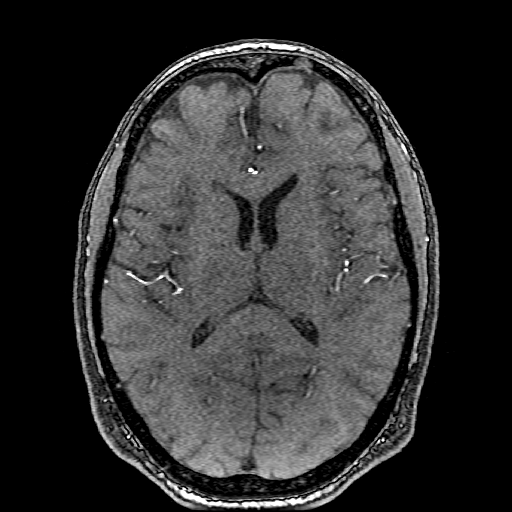
[im 147/154]
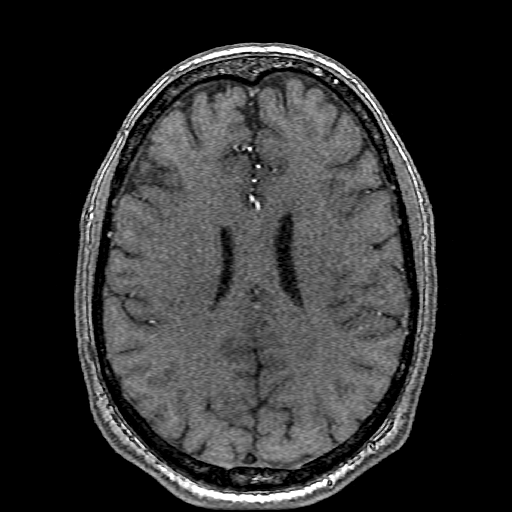

[Series 306: processed images · axial · 1.4mm · 0.27mm/px · 1 of 1 slices shown]
[im 1/1]
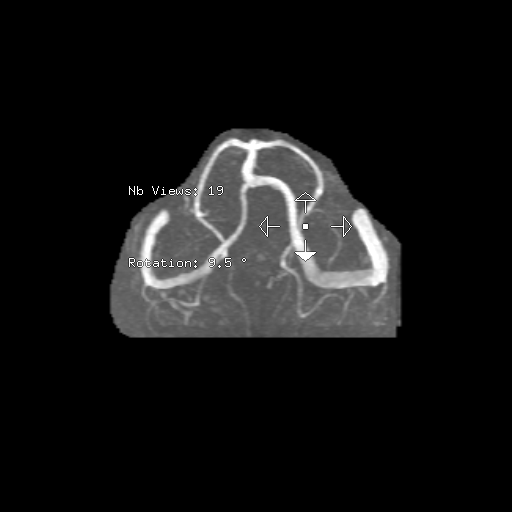

[20 of 48 positions shown; findings below may reference images not displayed]

FINDINGS: Both vertebral arteries patent to the basilar. PICA patent
bilaterally. Basilar widely patent. Superior cerebellar and
posterior cerebral arteries patent bilaterally

Cavernous carotid widely patent bilaterally without stenosis or
aneurysm. Anterior and middle cerebral arteries widely patent
bilaterally without stenosis or aneurysm.
IMPRESSION: Negative

## 2016-06-30 ENCOUNTER — Ambulatory Visit (INDEPENDENT_AMBULATORY_CARE_PROVIDER_SITE_OTHER): Payer: BLUE CROSS/BLUE SHIELD | Admitting: Physician Assistant

## 2016-06-30 DIAGNOSIS — F419 Anxiety disorder, unspecified: Secondary | ICD-10-CM | POA: Diagnosis not present

## 2016-06-30 MED ORDER — CLONAZEPAM 0.5 MG PO TABS: ORAL_TABLET | ORAL | 0 refills | Status: DC

## 2016-06-30 MED ORDER — ESCITALOPRAM OXALATE 10 MG PO TABS: ORAL_TABLET | ORAL | 0 refills | Status: DC

## 2016-06-30 NOTE — Patient Instructions (Addendum)
Keep taking medication as prescribed. Follow up with Dr. Tamala Julian in 3 months before you run out of medication.  Thank you for letting me participate in your health and well being.   IF you received an x-ray today, you will receive an invoice from [facility]. Please contact [facility] at [phone] with questions or concerns regarding your invoice.   IF you received labwork today, you will receive an invoice from [employer]. Please contact [facility] at [phone] with questions or concerns regarding your invoice.   Our billing staff will not be able to assist you with questions regarding bills from these companies.  You will be contacted with the lab results as soon as they are available. The fastest way to get your results is to activate your My Chart account. Instructions are located on the last page of this paperwork. If you have not heard from [place] regarding the results in 2 weeks, please contact this office.

## 2016-06-30 NOTE — Progress Notes (Signed)
   Brian Houston  MRN: [id number] DOB: [birth year]  Subjective:  Brian Houston is a 60 y.o. male seen in office today for a chief complaint of medication refll for lexapro10mg  daily and klonopin 0.5mg  as needed for anxiety.   Pt has been on lexapro for 6 months. Notes the Lexapro is working for him excellently. No side effects from the medication. He used to have severe panic attacks and he has not had any since he started this medication.   Pt has been on klonopin off and on for the past few years. He let his last refills expire. He still had four refills from the previous 5 refills Dr. Tamala Julian gave him in 09/2015. 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   Review of Systems  Constitutional: Negative for chills, diaphoresis, fatigue and fever.  Respiratory: Negative for cough and shortness of breath.   Cardiovascular: Positive for palpitations (baseline for patient). Negative for chest pain.  Gastrointestinal: Negative for constipation, diarrhea, nausea and vomiting.  Neurological: Negative for dizziness, light-headedness and headaches.    Patient Active Problem List   Diagnosis Date Noted  . TIA (transient ischemic attack) 04/05/2015  . Unspecified vitamin D deficiency 10/02/2012  . Pure hypercholesterolemia 10/02/2012  . Anxiety 09/01/2012  . Insomnia 09/01/2012  . Chest pain 09/01/2012  . Heel pain 09/01/2012  . Thoracic back pain 09/01/2012  . Abdominal pain, RUQ 09/01/2012    Current Outpatient Prescriptions on File Prior to Visit  Medication Sig Dispense Refill  . clonazePAM (KLONOPIN) 0.5 MG tablet Take 1/2-1 as needed once daily for anxiety 30 tablet 5  . escitalopram (LEXAPRO) 10 MG tablet TAKE 1 TABLET(10 MG) BY MOUTH DAILY 90 tablet 0   No current facility-administered medications on file prior to visit.     Allergies  Allergen Reactions  . Remeron [Mirtazapine] Other (See Comments)    Restless legs    . Omeprazole Rash  . Penicillins Rash     Objective:  BP 124/80 (BP Location: Right Arm, Patient Position: Sitting, Cuff Size: Normal)   Pulse 70   Temp 98.6 F (37 C) (Oral)   Resp 17   Ht 5' 11.5" (1.816 m)   Wt 196 lb (88.9 kg)   SpO2 97%   BMI 26.96 kg/m   Physical Exam  Constitutional: He is oriented to person, place, and time and well-developed, well-nourished, and in no distress.  HENT:  Head: Normocephalic and atraumatic.  Eyes: Conjunctivae are normal.  Neck: Normal range of motion.  Cardiovascular: Normal rate, regular rhythm and normal heart sounds.   Neurological: He is alert and oriented to person, place, and time. Gait normal.  Skin: Skin is warm and dry.  Psychiatric: Affect normal.  Vitals reviewed.  Assessment and Plan :  1. Anxiety -Instructed to follow back up with Dr. Tamala Julian in 3 months or sooner if he has any concerns - escitalopram (LEXAPRO) 10 MG tablet; TAKE 1 TABLET(10 MG) BY MOUTH DAILY  Dispense: 90 tablet; Refill: 0 - clonazePAM (KLONOPIN) 0.5 MG tablet; Take 1/2-1 as needed once daily for anxiety  Dispense: 30 tablet; Refill: 0  Tenna Delaine PA-C  [facility] 06/30/2016 11:03 AM

## 2016-08-18 ENCOUNTER — Ambulatory Visit: Payer: Self-pay | Admitting: Family Medicine

## 2016-08-24 ENCOUNTER — Ambulatory Visit (INDEPENDENT_AMBULATORY_CARE_PROVIDER_SITE_OTHER): Payer: BLUE CROSS/BLUE SHIELD | Admitting: Family Medicine

## 2016-08-24 ENCOUNTER — Encounter: Payer: Self-pay | Admitting: Family Medicine

## 2016-08-24 VITALS — BP 111/69 | HR 60 | Temp 98.5°F | Resp 16 | Ht 71.25 in | Wt 192.2 lb

## 2016-08-24 DIAGNOSIS — K219 Gastro-esophageal reflux disease without esophagitis: Secondary | ICD-10-CM

## 2016-08-24 DIAGNOSIS — F419 Anxiety disorder, unspecified: Secondary | ICD-10-CM | POA: Diagnosis not present

## 2016-08-24 DIAGNOSIS — R7302 Impaired glucose tolerance (oral): Secondary | ICD-10-CM

## 2016-08-24 DIAGNOSIS — R05 Cough: Secondary | ICD-10-CM | POA: Diagnosis not present

## 2016-08-24 DIAGNOSIS — J301 Allergic rhinitis due to pollen: Secondary | ICD-10-CM | POA: Diagnosis not present

## 2016-08-24 DIAGNOSIS — E7800 Pure hypercholesterolemia, unspecified: Secondary | ICD-10-CM | POA: Diagnosis not present

## 2016-08-24 DIAGNOSIS — F5104 Psychophysiologic insomnia: Secondary | ICD-10-CM | POA: Diagnosis not present

## 2016-08-24 DIAGNOSIS — R059 Cough, unspecified: Secondary | ICD-10-CM

## 2016-08-24 MED ORDER — ESCITALOPRAM OXALATE 10 MG PO TABS: ORAL_TABLET | ORAL | 1 refills | Status: DC

## 2016-08-24 MED ORDER — PANTOPRAZOLE SODIUM 40 MG PO TBEC: 40.0000 mg | DELAYED_RELEASE_TABLET | Freq: Every day | ORAL | 3 refills | Status: DC

## 2016-08-24 MED ORDER — CLONAZEPAM 0.5 MG PO TABS: ORAL_TABLET | ORAL | 1 refills | Status: DC

## 2016-08-24 MED ORDER — PREDNISONE 20 MG PO TABS: ORAL_TABLET | ORAL | 0 refills | Status: DC

## 2016-08-24 NOTE — Patient Instructions (Addendum)
ADD NASONEX TO CLARITIN ONCE DAILY. START PANTOPRAZOLE 40MG  ONE DAILY FOR REFLUX FOR 1-3 MONTHS. CONTINUE LEXAPRO AT CURRENT DOSE. TAKE PREDNISONE FOR TEN DAYS.     IF you received an x-ray today, you will receive an invoice from [facility]. Please contact [facility] at [phone] with questions or concerns regarding your invoice.   IF you received labwork today, you will receive an invoice from [facility]. Please contact [facility] at [phone] with questions or concerns regarding your invoice.   Our billing staff will not be able to assist you with questions regarding bills from these companies.  You will be contacted with the lab results as soon as they are available. The fastest way to get your results is to activate your My Chart account. Instructions are located on the last page of this paperwork. If you have not heard from [place] regarding the results in 2 weeks, please contact this office.

## 2016-08-24 NOTE — Progress Notes (Signed)
Subjective:    Patient ID: Brian Houston, male    DOB: 08/30/1955, 61 y.o.   MRN: [id number]  08/24/2016  Medication Refill (LEXAPRO and KLONOPIN)   HPI This 61 y.o. male presents for evaluation of anxiety.  Saw PA Wiseman in 06/29/16; very pleased with lexapro; no anxiety; taking Klonopin.     Cough: nighttime; started on8/06/2016; evaluated by Linna Darner; prescribed Tessalon Perles, Zithromax, hycodan.  Only coughing at nighttime.  Mucous small amount.  No cough during the day.  Having congestio in head.  +reflux currently.  Has been taking Pepcid AC; told about Omperazole issues; took brother in [place].    Was given Omperazole for GERD at [facility]; was actually gallbladder; took medication for 3 days; nex think had purple splotches and rash got a red mat; reacted allergically.  Admitted for four days while treated it.   Dexilant not covered by insurance.  Has tried Zantac years ago.  Cannot burp; gets relief with Dexilant.  Onset reflux for past few months. No burping;  No NSAIDs.   Eats early.  Has been taking Claritin for two weeks.  Has Nasonex without improvement for two months.    Immunization History  Administered Date(s) Administered  . Td 08/02/1993  . Tdap 09/24/2010   BP Readings from Last 3 Encounters:  08/24/16 111/69  06/30/16 124/80  03/23/16 122/72   Wt Readings from Last 3 Encounters:  08/24/16 192 lb 3.2 oz (87.2 kg)  06/30/16 196 lb (88.9 kg)  03/23/16 193 lb (87.5 kg)   Review of Systems  Constitutional: Negative for activity change, appetite change, chills, diaphoresis, fatigue and fever.  HENT: Positive for congestion, postnasal drip and rhinorrhea. Negative for sore throat.   Respiratory: Positive for cough. Negative for shortness of breath and wheezing.   Cardiovascular: Negative for chest pain, palpitations and leg swelling.  Gastrointestinal: Negative for abdominal pain, diarrhea, nausea and vomiting.  Endocrine: Negative for cold intolerance, heat  intolerance, polydipsia, polyphagia and polyuria.  Skin: Negative for color change, rash and wound.  Neurological: Negative for dizziness, tremors, seizures, syncope, facial asymmetry, speech difficulty, weakness, light-headedness, numbness and headaches.  Psychiatric/Behavioral: Negative for dysphoric mood and sleep disturbance. The patient is nervous/anxious.     Past Medical History:  Diagnosis Date  . Allergy   . Anxiety   . Depression   . GERD (gastroesophageal reflux disease)   . Glucose intolerance (impaired glucose tolerance)   . Hyperlipidemia    Past Surgical History:  Procedure Laterality Date  . APPENDECTOMY    . CHOLECYSTECTOMY    . FRACTURE SURGERY  11/30/2013   R wrist surgery fracture  . POLYSOMNOGRAPHY  08/02/1998   negative for OSA.  No treatment warranted.  . Stress testing  09/16/2012   low risk for ischemia. Ganji.  Symptoms: chest pain, SOB.   Allergies  Allergen Reactions  . Remeron [Mirtazapine] Other (See Comments)    Restless legs  . Omeprazole Rash  . Penicillins Rash   Current Outpatient Prescriptions  Medication Sig Dispense Refill  . clonazePAM (KLONOPIN) 0.5 MG tablet Take 1/2-1 as needed once daily for anxiety 30 tablet 1  . escitalopram (LEXAPRO) 10 MG tablet TAKE 1 TABLET(10 MG) BY MOUTH DAILY 90 tablet 1  . fluticasone (FLONASE) 50 MCG/ACT nasal spray Place 2 sprays into both nostrils daily. 16 g 11  . pantoprazole (PROTONIX) 40 MG tablet Take 1 tablet (40 mg total) by mouth daily. 30 tablet 3  . predniSONE (DELTASONE) 20 MG tablet 2 PO  QAM x 5 days, 1 PO QAM x 5 days 15 tablet 0   No current facility-administered medications for this visit.    Social History   Social History  . Marital status: Married    Spouse name: N/A  . Number of children: 3  . Years of education: N/A   Occupational History  . [employer]   Social History Main Topics  . Smoking status: Former Research scientist (life sciences)  . Smokeless  tobacco: Never Used  . Alcohol use No  . Drug use: No  . Sexual activity: No   Other Topics Concern  . Not on file   Social History Narrative   Marital status: married x 40 years.      Children: three children (36, 25, 23); three grandchildren .      Lives: with wife.  Caleb at [facility].        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      Exercise: no formal exercise program; walks to work (250 yards).      Seatbelts:  100%      Guns: none   Family History  Problem Relation Age of Onset  . Anxiety disorder Mother   . Hyperlipidemia Mother   . Stroke Father 35  . COPD Father   . Hypertension Father   . Diabetes Father   . Heart disease Father     heart valve replacement.  . Anxiety disorder Brother   . Mental illness Brother   . Diabetes Brother   . Hypertension Brother   . Hyperlipidemia Brother   . Anxiety disorder Daughter   . Anxiety disorder Maternal Grandmother   . Mental retardation Maternal Grandmother   . Diabetes Paternal Grandfather   . Mental illness Sister     Anxiety       Objective:    BP 111/69 (BP Location: Right Arm, Patient Position: Sitting, Cuff Size: Normal)   Pulse 60   Temp 98.5 F (36.9 C) (Oral)   Resp 16   Ht 5' 11.25" (1.81 m)   Wt 192 lb 3.2 oz (87.2 kg)   SpO2 95%   BMI 26.62 kg/m  Physical Exam  Constitutional: He is oriented to person, place, and time. He appears well-developed and well-nourished. No distress.  HENT:  Head: Normocephalic and atraumatic.  Right Ear: External ear normal.  Left Ear: External ear normal.  Nose: Nose normal.  Mouth/Throat: Oropharynx is clear and moist.  Eyes: Conjunctivae and EOM are normal. Pupils are equal, round, and reactive to light.  Neck: Normal range of motion. Neck supple. Carotid bruit is not present. No thyromegaly present.  Cardiovascular: Normal rate, regular rhythm, normal heart sounds and intact distal pulses.  Exam reveals no gallop and no friction rub.     No murmur heard. Pulmonary/Chest: Effort normal and breath sounds normal. He has no wheezes. He has no rales.  Abdominal: Soft. Bowel sounds are normal. He exhibits no distension and no mass. There is no tenderness. There is no rebound and no guarding.  Lymphadenopathy:    He has no cervical adenopathy.  Neurological: He is alert and oriented to person, place, and time. No cranial nerve deficit.  Skin: Skin is warm and dry. No rash noted. He is not diaphoretic.  Psychiatric: He has a normal mood and affect. His behavior is normal. Judgment and thought content normal.  Nursing note and vitals reviewed.  Depression screen [facility] 2/9 08/24/2016 09/29/2015 04/14/2015  01/14/2015 09/09/2014  Decreased Interest 0 3 0 0 0  Down, Depressed, Hopeless 0 3 0 0 0  PHQ - 2 Score 0 6 0 0 0  Altered sleeping - 3 - - -  Tired, decreased energy - 3 - - -  Change in appetite - 3 - - -  Feeling bad or failure about yourself  - 3 - - -  Trouble concentrating - 3 - - -  Moving slowly or fidgety/restless - 0 - - -  Suicidal thoughts - 0 - - -  PHQ-9 Score - 21 - - -  Difficult doing work/chores - Somewhat difficult - - -        Assessment & Plan:   1. Pure hypercholesterolemia   2. Anxiety   3. Psychophysiological insomnia   4. Gastroesophageal reflux disease without esophagitis   5. Acute seasonal allergic rhinitis due to pollen   6. Cough   7. Glucose intolerance (impaired glucose tolerance)    -cholesterol uncontrolled; recommend RTC fasting for repeat labs.  Intolerant to statin prescribed at last visit. -anxiety well controlled; refill of Lexapro and Klonopin provided. -recurrent GERD symptoms; rx for Protonix provided.   -suffering with chronic cough yet has uncontrolled allergic rhinitis and GERD; recommend starting Flonase and starting Protonix. Rx for Prednisone provided also due to onset of cough with viral illness several months ago.   No orders of the defined types were placed in this  encounter.  Meds ordered this encounter  Medications  . clonazePAM (KLONOPIN) 0.5 MG tablet    Sig: Take 1/2-1 as needed once daily for anxiety    Dispense:  30 tablet    Refill:  1  . escitalopram (LEXAPRO) 10 MG tablet    Sig: TAKE 1 TABLET(10 MG) BY MOUTH DAILY    Dispense:  90 tablet    Refill:  1  . pantoprazole (PROTONIX) 40 MG tablet    Sig: Take 1 tablet (40 mg total) by mouth daily.    Dispense:  30 tablet    Refill:  3  . predniSONE (DELTASONE) 20 MG tablet    Sig: 2 PO QAM x 5 days, 1 PO QAM x 5 days    Dispense:  15 tablet    Refill:  0    Return in about 6 months (around 02/21/2017) for complete physical examiniation.   Shatona Andujar Elayne Guerin, M.D. [facility] [address] [place], [place]  [address] [phone] phone [phone] fax

## 2016-08-27 ENCOUNTER — Encounter: Payer: Self-pay | Admitting: Family Medicine

## 2016-08-29 MED ORDER — FLUTICASONE PROPIONATE 50 MCG/ACT NA SUSP: 2.0000 | Freq: Every day | NASAL | 11 refills | Status: DC

## 2016-08-30 DIAGNOSIS — J301 Allergic rhinitis due to pollen: Secondary | ICD-10-CM | POA: Insufficient documentation

## 2016-08-30 DIAGNOSIS — R7302 Impaired glucose tolerance (oral): Secondary | ICD-10-CM | POA: Insufficient documentation

## 2016-08-30 DIAGNOSIS — K219 Gastro-esophageal reflux disease without esophagitis: Secondary | ICD-10-CM | POA: Insufficient documentation

## 2016-09-24 ENCOUNTER — Other Ambulatory Visit: Payer: Self-pay | Admitting: Physician Assistant

## 2016-09-24 DIAGNOSIS — F419 Anxiety disorder, unspecified: Secondary | ICD-10-CM

## 2016-11-06 ENCOUNTER — Ambulatory Visit (INDEPENDENT_AMBULATORY_CARE_PROVIDER_SITE_OTHER): Payer: BLUE CROSS/BLUE SHIELD | Admitting: Family Medicine

## 2016-11-06 ENCOUNTER — Ambulatory Visit (INDEPENDENT_AMBULATORY_CARE_PROVIDER_SITE_OTHER): Payer: BLUE CROSS/BLUE SHIELD

## 2016-11-06 VITALS — BP 96/61 | HR 68 | Temp 98.1°F | Resp 17 | Ht 72.0 in | Wt 196.0 lb

## 2016-11-06 DIAGNOSIS — G4710 Hypersomnia, unspecified: Secondary | ICD-10-CM

## 2016-11-06 DIAGNOSIS — R0609 Other forms of dyspnea: Secondary | ICD-10-CM

## 2016-11-06 DIAGNOSIS — M791 Myalgia, unspecified site: Secondary | ICD-10-CM

## 2016-11-06 DIAGNOSIS — R002 Palpitations: Secondary | ICD-10-CM

## 2016-11-06 DIAGNOSIS — M25511 Pain in right shoulder: Secondary | ICD-10-CM

## 2016-11-06 LAB — GLUCOSE, POCT (MANUAL RESULT ENTRY): POC GLUCOSE: 104 mg/dL — AB (ref 70–99)

## 2016-11-06 LAB — POCT CBC
Granulocyte percent: 51.9 %G (ref 37–80)
HEMATOCRIT: 46 % (ref 43.5–53.7)
Hemoglobin: 16 g/dL (ref 14.1–18.1)
LYMPH, POC: 2.6 (ref 0.6–3.4)
MCH: 32 pg — AB (ref 27–31.2)
MCHC: 34.9 g/dL (ref 31.8–35.4)
MCV: 91.6 fL (ref 80–97)
MID (CBC): 0.3 (ref 0–0.9)
MPV: 6.8 fL (ref 0–99.8)
POC Granulocyte: 3.1 (ref 2–6.9)
POC LYMPH PERCENT: 43.9 %L (ref 10–50)
POC MID %: 4.2 %M (ref 0–12)
Platelet Count, POC: 320 10*3/uL (ref 142–424)
RBC: 5.02 M/uL (ref 4.69–6.13)
RDW, POC: 13.8 %
WBC: 6 10*3/uL (ref 4.6–10.2)

## 2016-11-06 LAB — POCT URINALYSIS DIP (MANUAL ENTRY)
Bilirubin, UA: NEGATIVE
Blood, UA: NEGATIVE
GLUCOSE UA: NEGATIVE
Ketones, POC UA: NEGATIVE
LEUKOCYTES UA: NEGATIVE
Nitrite, UA: NEGATIVE
PROTEIN UA: NEGATIVE
Spec Grav, UA: 1.025 (ref 1.030–1.035)
Urobilinogen, UA: 0.2 (ref ?–2.0)
pH, UA: 6 (ref 5.0–8.0)

## 2016-11-06 NOTE — Patient Instructions (Addendum)
Decrease Protonix to every other day.     IF you received an x-ray today, you will receive an invoice from [facility]. Please contact [facility] at [phone] with questions or concerns regarding your invoice.   IF you received labwork today, you will receive an invoice from [facility]. Please contact [facility] at [phone] with questions or concerns regarding your invoice.   Our billing staff will not be able to assist you with questions regarding bills from these companies.  You will be contacted with the lab results as soon as they are available. The fastest way to get your results is to activate your My Chart account. Instructions are located on the last page of this paperwork. If you have not heard from [place] regarding the results in 2 weeks, please contact this office.

## 2016-11-06 NOTE — Progress Notes (Signed)
Subjective:    Patient ID: Brian Houston, male    DOB: 05-06-1956, 61 y.o.   MRN: [id number]  11/06/2016  Fatigue and Generalized Body Aches   HPI This 61 y.o. male presents for evaluation of muscle pains, joint pains, fatigue, shortness of breath. Onset one month ago.  No specific trigger.   Got up three days ago; did normal routine and was so out of breath, had to rest.  With shaving yesterday, had to rest due to R upper shoulder pain. Feels just like statin muscle aches.  Has been taking Protonix.  Sleeping 10-12 hours at a time.  Denies depression or anxiety.  Mentally stable.  Thinks sleeping well.  No insomnia.  Went to bed at 11:00pm; forgot to set alarm; woke up at 9:15am; overslept.  Only new medication is Protonix; started taking it 3 months.    Hands are swollen.  Neck, lower back, R shoulder hurting; mostly R sided pain.  Mild nausea. Denies chest pain; +palpitations have increased in past three months.  No orthopnea.  Did drive 1884 miles to [place] on motorcycle; symptoms may be due to motorcycle ride. No leg swelling.   No nocturia from baseline.  Has gained weight five pounds.  Drinks unsweetened decaf tea.  Drinking ice tea per day.  No coffee.  No soda.    Had a tick bite last summer late in summer. Remembered that with acute symptoms.    Immunization History  Administered Date(s) Administered  . Td 08/02/1993  . Tdap 09/24/2010   BP Readings from Last 3 Encounters:  11/06/16 96/61  08/24/16 111/69  06/30/16 124/80   Wt Readings from Last 3 Encounters:  11/06/16 196 lb (88.9 kg)  08/24/16 192 lb 3.2 oz (87.2 kg)  06/30/16 196 lb (88.9 kg)    Review of Systems  Constitutional: Positive for fatigue. Negative for activity change, appetite change, chills, diaphoresis and fever.  HENT: Negative for congestion, ear pain, postnasal drip, rhinorrhea, sinus pain, sinus pressure, trouble swallowing and voice change.   Eyes: Negative for visual disturbance.  Respiratory:  Negative for cough and shortness of breath.   Cardiovascular: Negative for chest pain, palpitations and leg swelling.  Gastrointestinal: Negative for abdominal distention, abdominal pain, anal bleeding, blood in stool, constipation, diarrhea, nausea, rectal pain and vomiting.  Endocrine: Negative for cold intolerance, heat intolerance, polydipsia, polyphagia and polyuria.  Genitourinary: Negative for difficulty urinating, discharge, dysuria, flank pain, frequency, genital sores, penile pain, penile swelling, scrotal swelling, testicular pain and urgency.  Musculoskeletal: Positive for arthralgias, back pain and myalgias.  Skin: Negative for rash.  Neurological: Negative for dizziness, tremors, seizures, syncope, facial asymmetry, speech difficulty, weakness, light-headedness, numbness and headaches.  Psychiatric/Behavioral: Negative for dysphoric mood and self-injury. The patient is not nervous/anxious.     Past Medical History:  Diagnosis Date  . Allergy   . Anxiety   . Depression   . GERD (gastroesophageal reflux disease)   . Glucose intolerance (impaired glucose tolerance)   . Hyperlipidemia    Past Surgical History:  Procedure Laterality Date  . APPENDECTOMY    . CHOLECYSTECTOMY    . FRACTURE SURGERY  11/30/2013   R wrist surgery fracture  . POLYSOMNOGRAPHY  08/02/1998   negative for OSA.  No treatment warranted.  . Stress testing  09/16/2012   low risk for ischemia. Ganji.  Symptoms: chest pain, SOB.   Allergies  Allergen Reactions  . Remeron [Mirtazapine] Other (See Comments)    Restless legs  . Omeprazole  Rash  . Penicillins Rash   Current Outpatient Prescriptions  Medication Sig Dispense Refill  . clonazePAM (KLONOPIN) 0.5 MG tablet Take 1/2-1 as needed once daily for anxiety 30 tablet 1  . escitalopram (LEXAPRO) 10 MG tablet TAKE 1 TABLET(10 MG) BY MOUTH DAILY 90 tablet 0  . fluticasone (FLONASE) 50 MCG/ACT nasal spray Place 2 sprays into both nostrils daily. 16 g 11  .  pantoprazole (PROTONIX) 40 MG tablet Take 1 tablet (40 mg total) by mouth daily. 30 tablet 3   No current facility-administered medications for this visit.    Social History   Social History  . Marital status: Married    Spouse name: N/A  . Number of children: 3  . Years of education: N/A   Occupational History  . [employer]   Social History Main Topics  . Smoking status: Former Research scientist (life sciences)  . Smokeless tobacco: Never Used  . Alcohol use No  . Drug use: No  . Sexual activity: No   Other Topics Concern  . Not on file   Social History Narrative   Marital status: married x 40 years.      Children: three children (36, 25, 23); three grandchildren .      Lives: with wife.  Caleb at [facility].        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      Exercise: no formal exercise program; walks to work (250 yards).      Seatbelts:  100%      Guns: none   Family History  Problem Relation Age of Onset  . Anxiety disorder Mother   . Hyperlipidemia Mother   . Stroke Father 26  . COPD Father   . Hypertension Father   . Diabetes Father   . Heart disease Father     heart valve replacement.  . Anxiety disorder Brother   . Mental illness Brother   . Diabetes Brother   . Hypertension Brother   . Hyperlipidemia Brother   . Anxiety disorder Daughter   . Anxiety disorder Maternal Grandmother   . Mental retardation Maternal Grandmother   . Diabetes Paternal Grandfather   . Mental illness Sister     Anxiety       Objective:    BP 96/61   Pulse 68   Temp 98.1 F (36.7 C) (Oral)   Resp 17   Ht 6' (1.829 m)   Wt 196 lb (88.9 kg)   SpO2 97%   BMI 26.58 kg/m  Physical Exam  Constitutional: He is oriented to person, place, and time. He appears well-developed and well-nourished. No distress.  HENT:  Head: Normocephalic and atraumatic.  Right Ear: External ear normal.  Left Ear: External ear normal.  Nose: Nose normal.    Mouth/Throat: Oropharynx is clear and moist.  Eyes: Conjunctivae and EOM are normal. Pupils are equal, round, and reactive to light.  Neck: Normal range of motion. Neck supple. Carotid bruit is not present. No thyromegaly present.  Cardiovascular: Normal rate, regular rhythm, normal heart sounds and intact distal pulses.  Exam reveals no gallop and no friction rub.   No murmur heard. Pulmonary/Chest: Effort normal and breath sounds normal. He has no wheezes. He has no rales.  Abdominal: Soft. Bowel sounds are normal. He exhibits no distension and no mass. There is no tenderness. There is no rebound and no guarding.  Musculoskeletal:       Right shoulder: He  exhibits pain. He exhibits normal strength.       Left shoulder: Normal.       Cervical back: He exhibits pain.  Lymphadenopathy:    He has no cervical adenopathy.  Neurological: He is alert and oriented to person, place, and time. No cranial nerve deficit.  Skin: Skin is warm and dry. No rash noted. He is not diaphoretic.  Psychiatric: He has a normal mood and affect. His behavior is normal.  Nursing note and vitals reviewed.  Results for orders placed or performed in visit on 11/06/16  POCT CBC  Result Value Ref Range   WBC 6.0 4.6 - 10.2 K/uL   Lymph, poc 2.6 0.6 - 3.4   POC LYMPH PERCENT 43.9 10 - 50 %L   MID (cbc) 0.3 0 - 0.9   POC MID % 4.2 0 - 12 %M   POC Granulocyte 3.1 2 - 6.9   Granulocyte percent 51.9 37 - 80 %G   RBC 5.02 4.69 - 6.13 M/uL   Hemoglobin 16.0 14.1 - 18.1 g/dL   HCT, POC 46.0 43.5 - 53.7 %   MCV 91.6 80 - 97 fL   MCH, POC 32.0 (A) 27 - 31.2 pg   MCHC 34.9 31.8 - 35.4 g/dL   RDW, POC 13.8 %   Platelet Count, POC 320 142 - 424 K/uL   MPV 6.8 0 - 99.8 fL  POCT urinalysis dipstick  Result Value Ref Range   Color, UA yellow yellow   Clarity, UA clear clear   Glucose, UA negative negative   Bilirubin, UA negative negative   Ketones, POC UA negative negative   Spec Grav, UA 1.025 1.030 - 1.035    Blood, UA negative negative   pH, UA 6.0 5.0 - 8.0   Protein Ur, POC negative negative   Urobilinogen, UA 0.2 Negative - 2.0   Nitrite, UA Negative Negative   Leukocytes, UA Negative Negative  POCT glucose (manual entry)  Result Value Ref Range   POC Glucose 104 (A) 70 - 99 mg/dl   Dg Chest 2 View  Result Date: 11/06/2016 CLINICAL DATA:  Shortness of breath with exertion EXAM: CHEST  2 VIEW COMPARISON:  August 24, 2012 FINDINGS: The heart size and mediastinal contours are within normal limits. Both lungs are clear. The visualized skeletal structures are unremarkable. IMPRESSION: No active cardiopulmonary disease. Electronically Signed   By: Dorise Bullion III M.D   On: 11/06/2016 10:57   EKG: NSR; no acute process; no arrhythmia.    Assessment & Plan:   1. Myalgia   2. Pain in joint of right shoulder   3. Palpitations   4. Hypersomnolence   5. Dyspnea on exertion    -new onset myalgias, dyspnea, palpitations, hypersomnia, DOE following long motorcycle ride.  Obtain labs.  Normal CXR; stable EKG; refer to cardiology for stress testing and echo. -pt with concern that side effects due to Protonix; recommend decreasing Protonix to qod dosing. -emotionally stable; denies recent stressors and depression or anxiety.   Orders Placed This Encounter  Procedures  . DG Chest 2 View    Standing Status:   Future    Number of Occurrences:   1    Standing Expiration Date:   11/06/2017    Order Specific Question:   Reason for Exam (SYMPTOM  OR DIAGNOSIS REQUIRED)    Answer:   dyspnea on exertion    Order Specific Question:   Preferred imaging location?    Answer:   External  .  Comprehensive metabolic panel  . TSH  . CK  . Sedimentation rate  . B. burgdorfi antibodies  . Ambulatory referral to Cardiology    Referral Priority:   Routine    Referral Type:   Consultation    Referral Reason:   Specialty Services Required    Requested Specialty:   Cardiology    Number of Visits Requested:    1  . POCT CBC  . POCT urinalysis dipstick  . POCT glucose (manual entry)  . EKG 12-Lead   No orders of the defined types were placed in this encounter.   No Follow-up on file.   Abbye Lao Elayne Guerin, M.D. Primary [facility] at [facility] previously [facility] [address] [place], [place]  [address] [phone] phone [phone] fax

## 2016-11-09 LAB — B. BURGDORFI ANTIBODIES: Lyme IgG/IgM Ab: <0.91 {ISR} (ref 0.00–0.90)

## 2016-11-09 LAB — COMPREHENSIVE METABOLIC PANEL
A/G RATIO: 1.6 (ref 1.2–2.2)
ALK PHOS: 63 IU/L (ref 39–117)
ALT: 33 IU/L (ref 0–44)
AST: 24 IU/L (ref 0–40)
Albumin: 4.3 g/dL (ref 3.6–4.8)
BUN/Creatinine Ratio: 17 (ref 10–24)
BUN: 19 mg/dL (ref 8–27)
Bilirubin Total: 0.6 mg/dL (ref 0.0–1.2)
CALCIUM: 9.6 mg/dL (ref 8.6–10.2)
CO2: 25 mmol/L (ref 18–29)
Chloride: 102 mmol/L (ref 96–106)
Creatinine, Ser: 1.15 mg/dL (ref 0.76–1.27)
GFR calc Af Amer: 79 mL/min/{1.73_m2} (ref >59)
GFR calc non Af Amer: 68 mL/min/{1.73_m2} (ref >59)
GLOBULIN, TOTAL: 2.7 g/dL (ref 1.5–4.5)
Glucose: 105 mg/dL — ABNORMAL HIGH (ref 65–99)
Potassium: 5.1 mmol/L (ref 3.5–5.2)
SODIUM: 142 mmol/L (ref 134–144)
Total Protein: 7 g/dL (ref 6.0–8.5)

## 2016-11-09 LAB — SEDIMENTATION RATE: Sed Rate: 2 mm/hr (ref 0–30)

## 2016-11-09 LAB — TSH: TSH: 1.82 u[IU]/mL (ref 0.450–4.500)

## 2016-11-09 LAB — CK: Total CK: 63 U/L (ref 24–204)

## 2016-11-15 ENCOUNTER — Encounter: Payer: Self-pay | Admitting: Family Medicine

## 2017-01-08 ENCOUNTER — Encounter: Payer: Self-pay | Admitting: Family Medicine

## 2017-01-09 ENCOUNTER — Encounter: Payer: Self-pay | Admitting: Family Medicine

## 2017-03-02 ENCOUNTER — Encounter: Payer: Self-pay | Admitting: Family Medicine

## 2017-03-02 ENCOUNTER — Ambulatory Visit (INDEPENDENT_AMBULATORY_CARE_PROVIDER_SITE_OTHER): Payer: BLUE CROSS/BLUE SHIELD | Admitting: Family Medicine

## 2017-03-02 VITALS — BP 111/76 | HR 66 | Temp 97.6°F | Resp 18 | Ht 72.0 in | Wt 195.8 lb

## 2017-03-02 DIAGNOSIS — F411 Generalized anxiety disorder: Secondary | ICD-10-CM | POA: Diagnosis not present

## 2017-03-02 DIAGNOSIS — R0789 Other chest pain: Secondary | ICD-10-CM

## 2017-03-02 MED ORDER — DULOXETINE HCL 30 MG PO CPEP: ORAL_CAPSULE | ORAL | 3 refills | Status: DC

## 2017-03-02 NOTE — Patient Instructions (Addendum)
Take Cymbalta 30 mg 1 daily for 1 week, then increase to twice daily  You were recently referred to Dr. Adrian Prows, cardiologist, and you said all you need to do was to call his office to activate that referral. I recommend you see him in the near future. If you have problems call back and we will put to the referral again.   Follow-up in a month or 6 weeks regarding how the Cymbalta is doing.    IF you received an x-ray today, you will receive an invoice from [facility]. Please contact [facility] at [phone] with questions or concerns regarding your invoice.   IF you received labwork today, you will receive an invoice from [facility]. Please contact [facility] at [phone] with questions or concerns regarding your invoice.   Our billing staff will not be able to assist you with questions regarding bills from these companies.  You will be contacted with the lab results as soon as they are available. The fastest way to get your results is to activate your My Chart account. Instructions are located on the last page of this paperwork. If you have not heard from [place] regarding the results in 2 weeks, please contact this office.

## 2017-03-02 NOTE — Progress Notes (Signed)
Patient ID: Deshane Cotroneo, male    DOB: 03-Nov-1955  Age: 61 y.o. MRN: [id number]  Chief Complaint  Patient presents with  . Chest Pain    pt states it feels like a panic attack which started yesterday morning...he states has anxiety and has had this tightness before     Subjective:   61 year old gentleman who is been coming to this practice for a long time and I've seen him in the past. He has a long history of anxiety, has been tried on numerous SSRIs. He had muscle pains from Lexapro and is now back on 5 mg of it, but the anxiety has not been doing as well. Yesterday he was in his office and developed chest tightness. He was unable to get the doing his work, but eventually well on home from the church office. He did some stuff in the yard and felt a little bit better. About 3 in the afternoon he took a clonazepam, and did better until about 8 PM and the symptoms came back on. He didn't rest well. He just feels nervous. He does not smoke. Has not had any documented heart problems. He was referred to a cardiologist. Months ago but postponed that has not gone back.  Current allergies, medications, problem list, past/family and social histories reviewed.  Objective:  BP 111/76   Pulse 66   Temp 97.6 F (36.4 C) (Oral)   Resp 18   Ht 6' (1.829 m)   Wt 195 lb 12.8 oz (88.8 kg)   SpO2 98%   BMI 26.56 kg/m   No major acute distress. A little anxious. No thyromegaly. Chest clear. Heart regular without murmur, gallop, or arrhythmia. He has a lot of stomach issues, but abdomen is soft and nontender today.  Assessment & Plan:   Assessment: 1. Chest tightness   2. Anxiety state       Plan: Check an EKG. Will probably need to get him back in the hands of a cardiologist for further evaluation.  EKG is normal.  Discussed with him using Cymbalta for his anxiety.  Orders Placed This Encounter  Procedures  . EKG 12-Lead    No orders of the defined types were placed in this  encounter.        Patient Instructions   Take Cymbalta 30 mg 1 daily for 1 week, then increase to twice daily  You were recently referred to Dr. Adrian Prows, cardiologist, and you said all you need to do was to call his office to activate that referral. I recommend you see him in the near future. If you have problems call back and we will put to the referral again.     IF you received an x-ray today, you will receive an invoice from [facility]. Please contact [facility] at [phone] with questions or concerns regarding your invoice.   IF you received labwork today, you will receive an invoice from [facility]. Please contact [facility] at [phone] with questions or concerns regarding your invoice.   Our billing staff will not be able to assist you with questions regarding bills from these companies.  You will be contacted with the lab results as soon as they are available. The fastest way to get your results is to activate your My Chart account. Instructions are located on the last page of this paperwork. If you have not heard from [place] regarding the results in 2 weeks, please contact this office.        No Follow-up on file.  HOPPER,DAVID, MD 03/02/2017

## 2017-03-03 ENCOUNTER — Encounter: Payer: Self-pay | Admitting: Family Medicine

## 2017-04-06 ENCOUNTER — Encounter: Payer: Self-pay | Admitting: Family Medicine

## 2017-04-18 NOTE — Progress Notes (Signed)
Subjective:    Patient ID: Brian Houston, male    DOB: Jul 31, 1956, 61 y.o.   MRN: [id number]  04/19/2017  Depression (4 month follow-up) and Anxiety   HPI This 61 y.o. male presents for follow-up evaluation of anxiety/depression, chest pain, hypercholesterolemia, and glucose intolerance.  Evaluated by Dr. Linna Darner on 03/02/17 due to chest tightness and palpitations.  Referred to cardiology in April 2018 yet postponed appointment.  Rx for Cymbalta 30mg  one daily provided at visit in August 2018.  S/p stress testing with Dr. Einar Gip and low risk.  No change in past four years.  Tolerating Cymbalta 30mg ; face was burning and eyes burning initially; rechallenged medication; symptoms resolved in one week. Now having upper body aches.   Tried bid Cymbalta but head freaked out at night.  Started having weird sensation.  Not dreaming but daydreaming. Taking 1/2-1 per week on average.  Rarely uses.     Lexapro was doing great until the week prior.   Every time tries to do something, gets sore in upper body; no lower body soreness.   Might be retiring from [employer].  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  Wants to travel some.  Wants to spend some time on the road.   "I don't want to do this anymore"  I get up dreading it and I go to bed and dread the next day.  Knows that must wake up the next day and start all over again.  Took a flexeril the other night at 2:00am; fell asleep at 4:00am.  Woke up at 10:30am.   Chronic insomnia for the past five years. Exercising some; not a lot.   Anxious every Wednesday and Sundays because of work. Panic attack hit on a Wednesday at lunch.   Will take motorcycle and go somewhere.   Guilt regarding other ways of releasing stress. Since starting Cymbalta, less anxious on Wed and Sun.   BP Readings from Last 3 Encounters:  04/19/17 118/76  03/02/17 111/76  11/06/16 96/61   Wt Readings from Last 3 Encounters:  04/19/17 192 lb (87.1 kg)  03/02/17  195 lb 12.8 oz (88.8 kg)  11/06/16 196 lb (88.9 kg)   Immunization History  Administered Date(s) Administered  . Td 08/02/1993  . Tdap 09/24/2010    Review of Systems  Constitutional: Negative for activity change, appetite change, chills, diaphoresis, fatigue and fever.  Respiratory: Negative for cough and shortness of breath.   Cardiovascular: Negative for chest pain, palpitations and leg swelling.  Gastrointestinal: Negative for abdominal pain, diarrhea, nausea and vomiting.  Endocrine: Negative for cold intolerance, heat intolerance, polydipsia, polyphagia and polyuria.  Musculoskeletal: Positive for arthralgias.  Skin: Negative for color change, rash and wound.  Neurological: Negative for dizziness, tremors, seizures, syncope, facial asymmetry, speech difficulty, weakness, light-headedness, numbness and headaches.  Psychiatric/Behavioral: Positive for dysphoric mood. Negative for sleep disturbance. The patient is nervous/anxious.     Past Medical History:  Diagnosis Date  . Allergy   . Anxiety   . Depression   . GERD (gastroesophageal reflux disease)   . Glucose intolerance (impaired glucose tolerance)   . Hyperlipidemia    Past Surgical History:  Procedure Laterality Date  . APPENDECTOMY    . CHOLECYSTECTOMY    . FRACTURE SURGERY  11/30/2013   R wrist surgery fracture  . POLYSOMNOGRAPHY  08/02/1998   negative for OSA.  No treatment warranted.  . Stress testing  09/16/2012   low risk for ischemia. [place].  Symptoms:  chest pain, SOB.   Allergies  Allergen Reactions  . Remeron [Mirtazapine] Other (See Comments)    Restless legs  . Omeprazole Rash  . Penicillins Rash   Current Outpatient Prescriptions  Medication Sig Dispense Refill  . clonazePAM (KLONOPIN) 0.5 MG tablet Take 1/2-1 as needed once daily for anxiety 30 tablet 1  . DULoxetine (CYMBALTA) 30 MG capsule Take 1 daily for 1 week, then increase to one twice daily for anxiety. 60 capsule 2   No current  facility-administered medications for this visit.    Social History   Social History  . Marital status: Married    Spouse name: N/A  . Number of children: 3  . Years of education: N/A   Occupational History  . [employer]   Social History Main Topics  . Smoking status: Former Research scientist (life sciences)  . Smokeless tobacco: Never Used  . Alcohol use No  . Drug use: No  . Sexual activity: No   Other Topics Concern  . Not on file   Social History Narrative   Marital status: married x 40 years.      Children: three children (36, 25, 23); four grandchildren (25, 84, 6, 14 months) .      Lives: with wife.  [facility].        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      Exercise: no formal exercise program; walks to work (250 yards).      Seatbelts:  100%      Guns: none   Family History  Problem Relation Age of Onset  . Anxiety disorder Mother   . Hyperlipidemia Mother   . Stroke Father 61  . COPD Father   . Hypertension Father   . Diabetes Father   . Heart disease Father        heart valve replacement.  . Anxiety disorder Brother   . Mental illness Brother   . Diabetes Brother   . Hypertension Brother   . Hyperlipidemia Brother   . Anxiety disorder Daughter   . Anxiety disorder Maternal Grandmother   . Mental retardation Maternal Grandmother   . Diabetes Paternal Grandfather   . Mental illness Sister        Anxiety       Objective:    BP 118/76   Pulse 77   Temp 98 F (36.7 C) (Oral)   Resp 16   Ht 5' 11.65" (1.82 m)   Wt 192 lb (87.1 kg)   SpO2 96%   BMI 26.29 kg/m  Physical Exam  Constitutional: He is oriented to person, place, and time. He appears well-developed and well-nourished. No distress.  HENT:  Head: Normocephalic and atraumatic.  Right Ear: External ear normal.  Left Ear: External ear normal.  Nose: Nose normal.  Mouth/Throat: Oropharynx is clear and moist.  Eyes: Pupils are equal,  round, and reactive to light. Conjunctivae and EOM are normal.  Neck: Normal range of motion. Neck supple. Carotid bruit is not present. No thyromegaly present.  Cardiovascular: Normal rate, regular rhythm, normal heart sounds and intact distal pulses.  Exam reveals no gallop and no friction rub.   No murmur heard. Pulmonary/Chest: Effort normal and breath sounds normal. He has no wheezes. He has no rales.  Abdominal: Soft. Bowel sounds are normal. He exhibits no distension and no mass. There is no tenderness. There is no rebound and no guarding.  Lymphadenopathy:    He  has no cervical adenopathy.  Neurological: He is alert and oriented to person, place, and time. No cranial nerve deficit.  Skin: Skin is warm and dry. No rash noted. He is not diaphoretic.  Psychiatric: He has a normal mood and affect. His behavior is normal.  Nursing note and vitals reviewed.   No results found. Depression screen [facility] 2/9 04/19/2017 03/02/2017 11/06/2016 08/24/2016 09/29/2015  Decreased Interest 0 0 0 0 3  Down, Depressed, Hopeless 0 0 0 0 3  PHQ - 2 Score 0 0 0 0 6  Altered sleeping - - - - 3  Tired, decreased energy - - - - 3  Change in appetite - - - - 3  Feeling bad or failure about yourself  - - - - 3  Trouble concentrating - - - - 3  Moving slowly or fidgety/restless - - - - 0  Suicidal thoughts - - - - 0  PHQ-9 Score - - - - 21  Difficult doing work/chores - - - - Somewhat difficult   Fall Risk  04/19/2017 03/02/2017 11/06/2016 08/24/2016 04/14/2015  Falls in the past year? No No No No No  Number falls in past yr: - - - - -  Injury with [place]:   1. Anxiety   2. Psychophysiological insomnia   3. Pure hypercholesterolemia   4. Gastroesophageal reflux disease without esophagitis   5. Vitamin D deficiency   6. Glucose intolerance   7. Anxiety state    -improved anxiety with switching Lexapro to Cymbalta; recommend increasing Cymbalta to 30mg   TWO every morning. -continue PRN Clonazepam; using once weekly. -obtain labs for chronic disease management. -s/p cardiology consultation for chest pain with palpitations; repeat stress testing negative.  Consistent with anxiety/panic attack. -PT REFUSES FLU VACCINE.  Orders Placed This Encounter  Procedures  . CBC with Differential/Platelet  . Comprehensive metabolic panel    Order Specific Question:   Has the patient fasted?    Answer:   Yes  . VITAMIN D 25 Hydroxy (Vit-D Deficiency, Fractures)  . Lipid panel    Order Specific Question:   Has the patient fasted?    Answer:   Yes  . Hemoglobin A1c   Meds ordered this encounter  Medications  . DULoxetine (CYMBALTA) 30 MG capsule    Sig: Take 1 daily for 1 week, then increase to one twice daily for anxiety.    Dispense:  60 capsule    Refill:  2  . clonazePAM (KLONOPIN) 0.5 MG tablet    Sig: Take 1/2-1 as needed once daily for anxiety    Dispense:  30 tablet    Refill:  1    Return in about 6 months (around 10/17/2017) for complete physical examiniation.   Kristi Elayne Guerin, M.D. Primary Care at [facility] [place] [address] [place], [place]  [address] [phone] phone [phone] fax

## 2017-04-19 ENCOUNTER — Ambulatory Visit (INDEPENDENT_AMBULATORY_CARE_PROVIDER_SITE_OTHER): Payer: BLUE CROSS/BLUE SHIELD | Admitting: Family Medicine

## 2017-04-19 ENCOUNTER — Encounter: Payer: Self-pay | Admitting: Family Medicine

## 2017-04-19 VITALS — BP 118/76 | HR 77 | Temp 98.0°F | Resp 16 | Ht 71.65 in | Wt 192.0 lb

## 2017-04-19 DIAGNOSIS — F5104 Psychophysiologic insomnia: Secondary | ICD-10-CM

## 2017-04-19 DIAGNOSIS — K219 Gastro-esophageal reflux disease without esophagitis: Secondary | ICD-10-CM

## 2017-04-19 DIAGNOSIS — E559 Vitamin D deficiency, unspecified: Secondary | ICD-10-CM | POA: Diagnosis not present

## 2017-04-19 DIAGNOSIS — E7800 Pure hypercholesterolemia, unspecified: Secondary | ICD-10-CM | POA: Diagnosis not present

## 2017-04-19 DIAGNOSIS — F411 Generalized anxiety disorder: Secondary | ICD-10-CM | POA: Diagnosis not present

## 2017-04-19 DIAGNOSIS — F419 Anxiety disorder, unspecified: Secondary | ICD-10-CM

## 2017-04-19 DIAGNOSIS — E7439 Other disorders of intestinal carbohydrate absorption: Secondary | ICD-10-CM

## 2017-04-19 MED ORDER — DULOXETINE HCL 30 MG PO CPEP: ORAL_CAPSULE | ORAL | 2 refills | Status: DC

## 2017-04-19 MED ORDER — CLONAZEPAM 0.5 MG PO TABS: ORAL_TABLET | ORAL | 1 refills | Status: AC

## 2017-04-19 NOTE — Patient Instructions (Addendum)
Try to increase CYMBALTA 30MG  TWO CAPSULES EVERY MORNING.    Living With Anxiety After being diagnosed with an anxiety disorder, you may be relieved to know why you have felt or behaved a certain way. It is natural to also feel overwhelmed about the treatment ahead and what it will mean for your life. With care and support, you can manage this condition and recover from it. How to cope with anxiety Dealing with stress Stress is your body's reaction to life changes and events, both good and bad. Stress can last just a few hours or it can be ongoing. Stress can play a major role in anxiety, so it is important to learn both how to cope with stress and how to think about it differently. Talk with your health care provider or a counselor to learn more about stress reduction. He or she may suggest some stress reduction techniques, such as:  Music therapy. This can include creating or listening to music that you enjoy and that inspires you.  Mindfulness-based meditation. This involves being aware of your normal breaths, rather than trying to control your breathing. It can be done while sitting or walking.  Centering prayer. This is a kind of meditation that involves focusing on a word, phrase, or sacred image that is meaningful to you and that brings you peace.  Deep breathing. To do this, expand your stomach and inhale slowly through your nose. Hold your breath for 3-5 seconds. Then exhale slowly, allowing your stomach muscles to relax.  Self-talk. This is a skill where you identify thought patterns that lead to anxiety reactions and correct those thoughts.  Muscle relaxation. This involves tensing muscles then relaxing them.  Choose a stress reduction technique that fits your lifestyle and personality. Stress reduction techniques take time and practice. Set aside 5-15 minutes a day to do them. Therapists can offer training in these techniques. The training may be covered by some insurance plans.  Other things you can do to manage stress include:  Keeping a stress diary. This can help you learn what triggers your stress and ways to control your response.  Thinking about how you respond to certain situations. You may not be able to control everything, but you can control your reaction.  Making time for activities that help you relax, and not feeling guilty about spending your time in this way.  Therapy combined with coping and stress-reduction skills provides the best chance for successful treatment. Medicines Medicines can help ease symptoms. Medicines for anxiety include:  Anti-anxiety drugs.  Antidepressants.  Beta-blockers.  Medicines may be used as the main treatment for anxiety disorder, along with therapy, or if other treatments are not working. Medicines should be prescribed by a health care provider. Relationships Relationships can play a big part in helping you recover. Try to spend more time connecting with trusted friends and family members. Consider going to couples counseling, taking family education classes, or going to family therapy. Therapy can help you and others better understand the condition. How to recognize changes in your condition Everyone has a different response to treatment for anxiety. Recovery from anxiety happens when symptoms decrease and stop interfering with your daily activities at home or work. This may mean that you will start to:  Have better concentration and focus.  Sleep better.  Be less irritable.  Have more energy.  Have improved memory.  It is important to recognize when your condition is getting worse. Contact your health care provider if your symptoms interfere  with home or work and you do not feel like your condition is improving. Where to find help and support: You can get help and support from these sources:  Self-help groups.  Online and [employer].  A trusted spiritual leader.  Couples  counseling.  Family education classes.  Family therapy.  Follow these instructions at home:  Eat a healthy diet that includes plenty of vegetables, fruits, whole grains, low-fat dairy products, and lean protein. Do not eat a lot of foods that are high in solid fats, added sugars, or salt.  Exercise. Most adults should do the following: ? Exercise for at least 150 minutes each week. The exercise should increase your heart rate and make you sweat (moderate-intensity exercise). ? Strengthening exercises at least twice a week.  Cut down on caffeine, tobacco, alcohol, and other potentially harmful substances.  Get the right amount and quality of sleep. Most adults need 7-9 hours of sleep each night.  Make choices that simplify your life.  Take over-the-counter and prescription medicines only as told by your health care provider.  Avoid caffeine, alcohol, and certain over-the-counter cold medicines. These may make you feel worse. Ask your pharmacist which medicines to avoid.  Keep all follow-up visits as told by your health care provider. This is important. Questions to ask your health care provider  Would I benefit from therapy?  How often should I follow up with a health care provider?  How long do I need to take medicine?  Are there any long-term side effects of my medicine?  Are there any alternatives to taking medicine? Contact a health care provider if:  You have a hard time staying focused or finishing daily tasks.  You spend many hours a day feeling worried about everyday life.  You become exhausted by worry.  You start to have headaches, feel tense, or have nausea.  You urinate more than normal.  You have diarrhea. Get help right away if:  You have a racing heart and shortness of breath.  You have thoughts of hurting yourself or others. If you ever feel like you may hurt yourself or others, or have thoughts about taking your own life, get help right away. You  can go to your nearest emergency department or call:  Your local emergency services (911 in the [place]  A suicide crisis helpline, such as the [facility] at 808-465-7751. This is open 24-hours a day.  Summary  Taking steps to deal with stress can help calm you.  Medicines cannot cure anxiety disorders, but they can help ease symptoms.  Family, friends, and partners can play a big part in helping you recover from an anxiety disorder. This information is not intended to replace advice given to you by your health care provider. Make sure you discuss any questions you have with your health care provider. Document Released: 07/13/2016 Document Revised: 07/13/2016 Document Reviewed: 07/13/2016 Elsevier Interactive Patient Education  2018 [employer].    IF you received an x-ray today, you will receive an invoice from [facility]. Please contact [facility] at [phone] with questions or concerns regarding your invoice.   IF you received labwork today, you will receive an invoice from [facility]. Please contact [facility] at [phone] with questions or concerns regarding your invoice.   Our billing staff will not be able to assist you with questions regarding bills from these companies.  You will be contacted with the lab results as soon as they are available. The fastest way to  get your results is to activate your My Chart account. Instructions are located on the last page of this paperwork. If you have not heard from [place] regarding the results in 2 weeks, please contact this office.

## 2017-04-20 LAB — COMPREHENSIVE METABOLIC PANEL
ALBUMIN: 4.5 g/dL (ref 3.6–4.8)
ALT: 39 IU/L (ref 0–44)
AST: 23 IU/L (ref 0–40)
Albumin/Globulin Ratio: 1.7 (ref 1.2–2.2)
Alkaline Phosphatase: 61 IU/L (ref 39–117)
BUN / CREAT RATIO: 17 (ref 10–24)
BUN: 19 mg/dL (ref 8–27)
Bilirubin Total: 0.5 mg/dL (ref 0.0–1.2)
CHLORIDE: 104 mmol/L (ref 96–106)
CO2: 24 mmol/L (ref 20–29)
CREATININE: 1.14 mg/dL (ref 0.76–1.27)
Calcium: 9.5 mg/dL (ref 8.6–10.2)
GFR calc Af Amer: 80 mL/min/{1.73_m2} (ref >59)
GFR calc non Af Amer: 69 mL/min/{1.73_m2} (ref >59)
GLUCOSE: 99 mg/dL (ref 65–99)
Globulin, Total: 2.6 g/dL (ref 1.5–4.5)
Potassium: 5 mmol/L (ref 3.5–5.2)
Sodium: 142 mmol/L (ref 134–144)
Total Protein: 7.1 g/dL (ref 6.0–8.5)

## 2017-04-20 LAB — CBC WITH DIFFERENTIAL/PLATELET
BASOS ABS: 0 10*3/uL (ref 0.0–0.2)
Basos: 0 %
EOS (ABSOLUTE): 0.2 10*3/uL (ref 0.0–0.4)
Eos: 2 %
HEMOGLOBIN: 15.1 g/dL (ref 13.0–17.7)
Hematocrit: 45.6 % (ref 37.5–51.0)
IMMATURE GRANS (ABS): 0 10*3/uL (ref 0.0–0.1)
Immature Granulocytes: 0 %
LYMPHS: 40 %
Lymphocytes Absolute: 2.9 10*3/uL (ref 0.7–3.1)
MCH: 30.9 pg (ref 26.6–33.0)
MCHC: 33.1 g/dL (ref 31.5–35.7)
MCV: 93 fL (ref 79–97)
Monocytes Absolute: 0.6 10*3/uL (ref 0.1–0.9)
Monocytes: 8 %
Neutrophils Absolute: 3.7 10*3/uL (ref 1.4–7.0)
Neutrophils: 50 %
PLATELETS: 275 10*3/uL (ref 150–379)
RBC: 4.89 x10E6/uL (ref 4.14–5.80)
RDW: 14.3 % (ref 12.3–15.4)
WBC: 7.4 10*3/uL (ref 3.4–10.8)

## 2017-04-20 LAB — VITAMIN D 25 HYDROXY (VIT D DEFICIENCY, FRACTURES): VIT D 25 HYDROXY: 46.4 ng/mL (ref 30.0–100.0)

## 2017-04-20 LAB — LIPID PANEL
CHOLESTEROL TOTAL: 205 mg/dL — AB (ref 100–199)
Chol/HDL Ratio: 3.9 ratio (ref 0.0–5.0)
HDL: 52 mg/dL (ref >39)
LDL Calculated: 131 mg/dL — ABNORMAL HIGH (ref 0–99)
TRIGLYCERIDES: 110 mg/dL (ref 0–149)
VLDL CHOLESTEROL CAL: 22 mg/dL (ref 5–40)

## 2017-04-20 LAB — HEMOGLOBIN A1C
Est. average glucose Bld gHb Est-mCnc: 123 mg/dL
HEMOGLOBIN A1C: 5.9 % — AB (ref 4.8–5.6)

## 2017-04-29 ENCOUNTER — Encounter: Payer: Self-pay | Admitting: Family Medicine

## 2017-06-13 ENCOUNTER — Ambulatory Visit (INDEPENDENT_AMBULATORY_CARE_PROVIDER_SITE_OTHER): Payer: Self-pay | Admitting: Urgent Care

## 2017-06-13 ENCOUNTER — Encounter: Payer: Self-pay | Admitting: Urgent Care

## 2017-06-13 VITALS — BP 116/80 | HR 70 | Temp 97.8°F | Resp 16 | Ht 71.0 in | Wt 193.8 lb

## 2017-06-13 DIAGNOSIS — G4700 Insomnia, unspecified: Secondary | ICD-10-CM

## 2017-06-13 DIAGNOSIS — Z024 Encounter for examination for driving license: Secondary | ICD-10-CM

## 2017-06-13 DIAGNOSIS — E782 Mixed hyperlipidemia: Secondary | ICD-10-CM

## 2017-06-13 DIAGNOSIS — F419 Anxiety disorder, unspecified: Secondary | ICD-10-CM

## 2017-06-13 NOTE — Progress Notes (Signed)
    [place] Examination   Brian Houston is a 61 y.o. male who presents today for a DOT physical exam. The patient reports using clonazepam once weekly for anxiety/sleep. Has borderline mixed hyperlipidemia managed with diet alone. Has spent a night in the hospital for an allergic reaction. Has a history of TIA with normal imaging. Fractured his wrist in 2015. Denies dizziness, chronic headache, blurred vision, chest pain, shortness of breath, heart racing, palpitations, nausea, vomiting, abdominal pain, hematuria, lower leg swelling. Denies smoking cigarettes or drinking alcohol.   The following portions of the patient's history were reviewed and updated as appropriate: allergies, current medications, past family history, past medical history, past social history and past surgical history.  Objective:   BP 116/80   Pulse 70   Temp 97.8 F (36.6 C) (Oral)   Resp 16   Ht 5\' 11"  (1.803 m)   Wt 193 lb 12.8 oz (87.9 kg)   SpO2 97%   BMI 27.03 kg/m   Vision/hearing:  Visual Acuity Screening   Right eye Left eye Both eyes  Without correction:     With correction: 20/13 20/13-1 20/13-1  Comments: Peripheral Vision: Right eye 85 degrees. Left eye 85 degrees.  The patient can distinguish the colors red, amber and green.  Hearing Screening Comments: The patient was able to hear a forced whisper from 10 feet.  Patient can recognize and distinguish among traffic control signals and devices showing standard red, green, and amber colors.  Corrective lenses required: Yes  Monocular Vision?: No  Hearing aid requirement: No  Physical Exam  Constitutional: He is oriented to person, place, and time. He appears well-developed and well-nourished.  HENT:  TM's intact bilaterally, no effusions or erythema. Nasal turbinates pink and moist, nasal passages patent. No sinus tenderness. Oropharynx clear, mucous membranes moist, dentition in good repair.  Eyes: Conjunctivae and  EOM are normal. Pupils are equal, round, and reactive to light. Right eye exhibits no discharge. Left eye exhibits no discharge. No scleral icterus.  Neck: Normal range of motion. Neck supple.  Cardiovascular: Normal rate, regular rhythm and intact distal pulses. Exam reveals no gallop and no friction rub.  No murmur heard. Pulmonary/Chest: No stridor. No respiratory distress. He has no wheezes. He has no rales.  Abdominal: Soft. Bowel sounds are normal. He exhibits no distension and no mass. There is no tenderness.  Musculoskeletal: Normal range of motion. He exhibits no edema or tenderness.  Lymphadenopathy:    He has no cervical adenopathy.  Neurological: He is alert and oriented to person, place, and time. He has normal reflexes. He displays normal reflexes. Coordination normal.  Skin: Skin is warm and dry. No rash noted. No erythema. No pallor.  Psychiatric: He has a normal mood and affect.    Labs: Comments: Urine Specimen  SpGr:  1.010   Blood:  Negative   Protein:  Negative   Sugar:  Negative  Assessment:    Healthy male exam.  Meets standards in 41 CFR 391.41;  qualifies for 2 year certificate.    Plan:   Medical examiners certificate completed and printed. Return as needed.  Jaynee Eagles, PA-C Primary [facility] at [facility] [phone] 06/13/2017  4:13 PM

## 2017-06-13 NOTE — Patient Instructions (Signed)
Health Maintenance, Male A healthy lifestyle and preventive care is important for your health and wellness. Ask your health care provider about what schedule of regular examinations is right for you. What should I know about weight and diet? Eat a Healthy Diet  Eat plenty of vegetables, fruits, whole grains, low-fat dairy products, and lean protein.  Do not eat a lot of foods high in solid fats, added sugars, or salt.  Maintain a Healthy Weight Regular exercise can help you achieve or maintain a healthy weight. You should:  Do at least 150 minutes of exercise each week. The exercise should increase your heart rate and make you sweat (moderate-intensity exercise).  Do strength-training exercises at least twice a week.  Watch Your Levels of Cholesterol and Blood Lipids  Have your blood tested for lipids and cholesterol every 5 years starting at 62 years of age. If you are at high risk for heart disease, you should start having your blood tested when you are 62 years old. You may need to have your cholesterol levels checked more often if: ? Your lipid or cholesterol levels are high. ? You are older than 62 years of age. ? You are at high risk for heart disease.  What should I know about cancer screening? Many types of cancers can be detected early and may often be prevented. Lung Cancer  You should be screened every year for lung cancer if: ? You are a current smoker who has smoked for at least 30 years. ? You are a former smoker who has quit within the past 15 years.  Talk to your health care provider about your screening options, when you should start screening, and how often you should be screened.  Colorectal Cancer  Routine colorectal cancer screening usually begins at 62 years of age and should be repeated every 5-10 years until you are 62 years old. You may need to be screened more often if early forms of precancerous polyps or small growths are found. Your health care provider  may recommend screening at an earlier age if you have risk factors for colon cancer.  Your health care provider may recommend using home test kits to check for hidden blood in the stool.  A small camera at the end of a tube can be used to examine your colon (sigmoidoscopy or colonoscopy). This checks for the earliest forms of colorectal cancer.  Prostate and Testicular Cancer  Depending on your age and overall health, your health care provider may do certain tests to screen for prostate and testicular cancer.  Talk to your health care provider about any symptoms or concerns you have about testicular or prostate cancer.  Skin Cancer  Check your skin from head to toe regularly.  Tell your health care provider about any new moles or changes in moles, especially if: ? There is a change in a mole's size, shape, or color. ? You have a mole that is larger than a pencil eraser.  Always use sunscreen. Apply sunscreen liberally and repeat throughout the day.  Protect yourself by wearing long sleeves, pants, a wide-brimmed hat, and sunglasses when outside.  What should I know about heart disease, diabetes, and high blood pressure?  If you are 18-39 years of age, have your blood pressure checked every 3-5 years. If you are 40 years of age or older, have your blood pressure checked every year. You should have your blood pressure measured twice-once when you are at a hospital or clinic, and once when   you are not at a hospital or clinic. Record the average of the two measurements. To check your blood pressure when you are not at a hospital or clinic, you can use: ? An automated blood pressure machine at a pharmacy. ? A home blood pressure monitor.  Talk to your health care provider about your target blood pressure.  If you are between 45-79 years old, ask your health care provider if you should take aspirin to prevent heart disease.  Have regular diabetes screenings by checking your fasting blood  sugar level. ? If you are at a normal weight and have a low risk for diabetes, have this test once every three years after the age of 45. ? If you are overweight and have a high risk for diabetes, consider being tested at a younger age or more often.  A one-time screening for abdominal aortic aneurysm (AAA) by ultrasound is recommended for men aged 65-75 years who are current or former smokers. What should I know about preventing infection? Hepatitis B If you have a higher risk for hepatitis B, you should be screened for this virus. Talk with your health care provider to find out if you are at risk for hepatitis B infection. Hepatitis C Blood testing is recommended for:  Everyone born from 1945 through 1965.  Anyone with known risk factors for hepatitis C.  Sexually Transmitted Diseases (STDs)  You should be screened each year for STDs including gonorrhea and chlamydia if: ? You are sexually active and are younger than 62 years of age. ? You are older than 62 years of age and your health care provider tells you that you are at risk for this type of infection. ? Your sexual activity has changed since you were last screened and you are at an increased risk for chlamydia or gonorrhea. Ask your health care provider if you are at risk.  Talk with your health care provider about whether you are at high risk of being infected with HIV. Your health care provider may recommend a prescription medicine to help prevent HIV infection.  What else can I do?  Schedule regular health, dental, and eye exams.  Stay current with your vaccines (immunizations).  Do not use any tobacco products, such as cigarettes, chewing tobacco, and e-cigarettes. If you need help quitting, ask your health care provider.  Limit alcohol intake to no more than 2 drinks per day. One drink equals 12 ounces of beer, 5 ounces of wine, or 1 ounces of hard liquor.  Do not use street drugs.  Do not share needles.  Ask your  health care provider for help if you need support or information about quitting drugs.  Tell your health care provider if you often feel depressed.  Tell your health care provider if you have ever been abused or do not feel safe at home. This information is not intended to replace advice given to you by your health care provider. Make sure you discuss any questions you have with your health care provider. Document Released: 01/15/2008 Document Revised: 03/17/2016 Document Reviewed: 04/22/2015 [employer] Interactive Patient Education  2018 [employer]     IF you received an x-ray today, you will receive an invoice from [facility]. Please contact [facility] at [phone] with questions or concerns regarding your invoice.   IF you received labwork today, you will receive an invoice from [facility]. Please contact [facility] at [phone] with questions or concerns regarding your invoice.   Our billing staff will not be   able to assist you with questions regarding bills from these companies.  You will be contacted with the lab results as soon as they are available. The fastest way to get your results is to activate your My Chart account. Instructions are located on the last page of this paperwork. If you have not heard from us regarding the results in 2 weeks, please contact this office.       

## 2017-10-19 ENCOUNTER — Encounter: Payer: BLUE CROSS/BLUE SHIELD | Admitting: Family Medicine

## 2017-12-27 ENCOUNTER — Encounter: Payer: Self-pay | Admitting: Family Medicine
